# Patient Record
Sex: Female | Born: 1960
Health system: Southern US, Community
[De-identification: ages and names within clinical notes are randomized; demographics above are authoritative.]

## PROBLEM LIST (undated history)

## (undated) DIAGNOSIS — Z9889 Other specified postprocedural states: Secondary | ICD-10-CM

## (undated) DIAGNOSIS — I10 Essential (primary) hypertension: Secondary | ICD-10-CM

## (undated) DIAGNOSIS — T8859XA Other complications of anesthesia, initial encounter: Secondary | ICD-10-CM

## (undated) DIAGNOSIS — F329 Major depressive disorder, single episode, unspecified: Secondary | ICD-10-CM

## (undated) DIAGNOSIS — E785 Hyperlipidemia, unspecified: Secondary | ICD-10-CM

## (undated) DIAGNOSIS — I2510 Atherosclerotic heart disease of native coronary artery without angina pectoris: Secondary | ICD-10-CM

## (undated) DIAGNOSIS — F32A Depression, unspecified: Secondary | ICD-10-CM

## (undated) DIAGNOSIS — K5792 Diverticulitis of intestine, part unspecified, without perforation or abscess without bleeding: Secondary | ICD-10-CM

## (undated) DIAGNOSIS — K219 Gastro-esophageal reflux disease without esophagitis: Secondary | ICD-10-CM

## (undated) DIAGNOSIS — R112 Nausea with vomiting, unspecified: Secondary | ICD-10-CM

## (undated) DIAGNOSIS — F419 Anxiety disorder, unspecified: Secondary | ICD-10-CM

## (undated) HISTORY — PX: TONSILECTOMY/ADENOIDECTOMY WITH MYRINGOTOMY: SHX6125

## (undated) HISTORY — DX: Atherosclerotic heart disease of native coronary artery without angina pectoris: I25.10

## (undated) HISTORY — DX: Anxiety disorder, unspecified: F41.9

## (undated) HISTORY — DX: Depression, unspecified: F32.A

## (undated) HISTORY — PX: TUBAL LIGATION: SHX77

## (undated) HISTORY — PX: CHOLECYSTECTOMY: SHX55

## (undated) HISTORY — PX: ANKLE FRACTURE SURGERY: SHX122

## (undated) HISTORY — DX: Major depressive disorder, single episode, unspecified: F32.9

## (undated) HISTORY — DX: Hyperlipidemia, unspecified: E78.5

---

## 2001-08-29 ENCOUNTER — Emergency Department (HOSPITAL_COMMUNITY): Admission: EM | Admit: 2001-08-29 | Discharge: 2001-08-30 | Payer: Self-pay | Admitting: Emergency Medicine

## 2001-08-29 ENCOUNTER — Encounter: Payer: Self-pay | Admitting: Emergency Medicine

## 2003-05-06 ENCOUNTER — Encounter: Admission: RE | Admit: 2003-05-06 | Discharge: 2003-05-25 | Payer: Self-pay | Admitting: Occupational Medicine

## 2003-08-05 ENCOUNTER — Emergency Department (HOSPITAL_COMMUNITY): Admission: EM | Admit: 2003-08-05 | Discharge: 2003-08-06 | Payer: Self-pay | Admitting: Emergency Medicine

## 2004-02-06 ENCOUNTER — Emergency Department (HOSPITAL_COMMUNITY): Admission: EM | Admit: 2004-02-06 | Discharge: 2004-02-07 | Payer: Self-pay | Admitting: Emergency Medicine

## 2004-07-22 ENCOUNTER — Ambulatory Visit: Payer: Self-pay | Admitting: Sports Medicine

## 2004-08-09 ENCOUNTER — Ambulatory Visit: Payer: Self-pay | Admitting: Sports Medicine

## 2005-09-04 ENCOUNTER — Encounter: Admission: RE | Admit: 2005-09-04 | Discharge: 2005-09-04 | Payer: Self-pay | Admitting: Sports Medicine

## 2006-10-08 ENCOUNTER — Inpatient Hospital Stay (HOSPITAL_COMMUNITY): Admission: AD | Admit: 2006-10-08 | Discharge: 2006-10-10 | Payer: Self-pay | Admitting: Psychiatry

## 2006-10-08 ENCOUNTER — Ambulatory Visit: Payer: Self-pay | Admitting: Psychiatry

## 2007-04-05 ENCOUNTER — Ambulatory Visit (HOSPITAL_COMMUNITY): Admission: RE | Admit: 2007-04-05 | Discharge: 2007-04-05 | Payer: Self-pay | Admitting: Obstetrics and Gynecology

## 2007-04-22 ENCOUNTER — Emergency Department (HOSPITAL_COMMUNITY): Admission: EM | Admit: 2007-04-22 | Discharge: 2007-04-22 | Payer: Self-pay | Admitting: Emergency Medicine

## 2008-06-12 ENCOUNTER — Ambulatory Visit (HOSPITAL_COMMUNITY): Admission: RE | Admit: 2008-06-12 | Discharge: 2008-06-12 | Payer: Self-pay | Admitting: Obstetrics and Gynecology

## 2009-07-09 ENCOUNTER — Ambulatory Visit (HOSPITAL_COMMUNITY): Admission: RE | Admit: 2009-07-09 | Discharge: 2009-07-09 | Payer: Self-pay | Admitting: Obstetrics and Gynecology

## 2010-02-08 ENCOUNTER — Emergency Department (HOSPITAL_COMMUNITY): Admission: EM | Admit: 2010-02-08 | Discharge: 2010-02-08 | Payer: Self-pay | Admitting: Emergency Medicine

## 2010-02-10 ENCOUNTER — Ambulatory Visit (HOSPITAL_BASED_OUTPATIENT_CLINIC_OR_DEPARTMENT_OTHER): Admission: RE | Admit: 2010-02-10 | Discharge: 2010-02-11 | Payer: Self-pay | Admitting: Orthopedic Surgery

## 2010-02-25 ENCOUNTER — Ambulatory Visit: Payer: Self-pay | Admitting: Surgery

## 2010-02-25 ENCOUNTER — Encounter (INDEPENDENT_AMBULATORY_CARE_PROVIDER_SITE_OTHER): Payer: Self-pay | Admitting: Orthopedic Surgery

## 2010-02-25 ENCOUNTER — Ambulatory Visit: Admission: RE | Admit: 2010-02-25 | Discharge: 2010-02-25 | Payer: Self-pay | Admitting: Orthopedic Surgery

## 2010-04-11 ENCOUNTER — Encounter
Admission: RE | Admit: 2010-04-11 | Discharge: 2010-05-19 | Payer: Self-pay | Source: Home / Self Care | Attending: Orthopedic Surgery | Admitting: Orthopedic Surgery

## 2010-05-23 ENCOUNTER — Encounter
Admission: RE | Admit: 2010-05-23 | Discharge: 2010-06-21 | Payer: Self-pay | Source: Home / Self Care | Attending: Orthopedic Surgery | Admitting: Orthopedic Surgery

## 2010-05-30 ENCOUNTER — Encounter: Admit: 2010-05-30 | Payer: Self-pay | Admitting: Orthopedic Surgery

## 2010-06-09 ENCOUNTER — Encounter: Admit: 2010-06-09 | Payer: Self-pay | Admitting: Orthopedic Surgery

## 2010-06-12 ENCOUNTER — Encounter: Payer: Self-pay | Admitting: Obstetrics and Gynecology

## 2010-09-19 ENCOUNTER — Inpatient Hospital Stay (HOSPITAL_COMMUNITY)
Admission: AD | Admit: 2010-09-19 | Discharge: 2010-09-19 | Disposition: A | Discharge status: Home / Self Care | Payer: 59 | Source: Ambulatory Visit | Attending: Obstetrics and Gynecology | Admitting: Obstetrics and Gynecology

## 2010-09-19 ENCOUNTER — Inpatient Hospital Stay (HOSPITAL_COMMUNITY): Payer: 59

## 2010-09-19 DIAGNOSIS — K5732 Diverticulitis of large intestine without perforation or abscess without bleeding: Secondary | ICD-10-CM | POA: Insufficient documentation

## 2010-09-19 DIAGNOSIS — R1032 Left lower quadrant pain: Secondary | ICD-10-CM | POA: Insufficient documentation

## 2010-09-19 LAB — URINE MICROSCOPIC-ADD ON

## 2010-09-19 LAB — DIFFERENTIAL
Basophils Absolute: 0.1 K/uL (ref 0.0–0.1)
Basophils Relative: 1 % (ref 0–1)
Eosinophils Absolute: 0.3 K/uL (ref 0.0–0.7)
Eosinophils Relative: 3 % (ref 0–5)
Lymphocytes Relative: 20 % (ref 12–46)
Lymphs Abs: 2.1 K/uL (ref 0.7–4.0)
Monocytes Absolute: 0.5 K/uL (ref 0.1–1.0)
Monocytes Relative: 5 % (ref 3–12)
Neutro Abs: 7.5 K/uL (ref 1.7–7.7)
Neutrophils Relative %: 72 % (ref 43–77)

## 2010-09-19 LAB — URINALYSIS, ROUTINE W REFLEX MICROSCOPIC
Glucose, UA: 250 mg/dL — AB
Ketones, ur: 15 mg/dL — AB
Protein, ur: 100 mg/dL — AB
Urobilinogen, UA: >8 mg/dL — ABNORMAL HIGH (ref 0.0–1.0)

## 2010-09-19 LAB — COMPREHENSIVE METABOLIC PANEL
AST: 32 U/L (ref 0–37)
Alkaline Phosphatase: 98 U/L (ref 39–117)
BUN: 5 mg/dL — ABNORMAL LOW (ref 6–23)
CO2: 29 mEq/L (ref 19–32)
Calcium: 9.7 mg/dL (ref 8.4–10.5)
GFR calc Af Amer: >60 mL/min (ref >60)
Potassium: 4.7 mEq/L (ref 3.5–5.1)
Sodium: 140 mEq/L (ref 135–145)

## 2010-09-19 LAB — CBC
HCT: 43.7 % (ref 36.0–46.0)
Hemoglobin: 14.4 g/dL (ref 12.0–15.0)
MCH: 31 pg (ref 26.0–34.0)
MCHC: 33 g/dL (ref 30.0–36.0)
MCV: 94 fL (ref 78.0–100.0)
Platelets: 349 K/uL (ref 150–400)
RBC: 4.65 MIL/uL (ref 3.87–5.11)
RDW: 12.6 % (ref 11.5–15.5)
WBC: 10.4 K/uL (ref 4.0–10.5)

## 2010-09-22 ENCOUNTER — Encounter (HOSPITAL_COMMUNITY): Payer: Self-pay

## 2010-09-22 ENCOUNTER — Ambulatory Visit (HOSPITAL_COMMUNITY)
Admission: RE | Admit: 2010-09-22 | Discharge: 2010-09-22 | Disposition: A | Discharge status: Home / Self Care | Payer: 59 | Source: Ambulatory Visit | Attending: Gastroenterology | Admitting: Gastroenterology

## 2010-09-22 ENCOUNTER — Other Ambulatory Visit (HOSPITAL_COMMUNITY): Payer: Self-pay | Admitting: Gastroenterology

## 2010-09-22 DIAGNOSIS — K7689 Other specified diseases of liver: Secondary | ICD-10-CM | POA: Insufficient documentation

## 2010-09-22 DIAGNOSIS — K5732 Diverticulitis of large intestine without perforation or abscess without bleeding: Secondary | ICD-10-CM | POA: Insufficient documentation

## 2010-09-22 DIAGNOSIS — Z9089 Acquired absence of other organs: Secondary | ICD-10-CM | POA: Insufficient documentation

## 2010-09-22 DIAGNOSIS — R109 Unspecified abdominal pain: Secondary | ICD-10-CM | POA: Insufficient documentation

## 2010-09-22 DIAGNOSIS — R634 Abnormal weight loss: Secondary | ICD-10-CM | POA: Insufficient documentation

## 2010-09-22 DIAGNOSIS — K5792 Diverticulitis of intestine, part unspecified, without perforation or abscess without bleeding: Secondary | ICD-10-CM

## 2010-09-22 DIAGNOSIS — D259 Leiomyoma of uterus, unspecified: Secondary | ICD-10-CM | POA: Insufficient documentation

## 2010-09-22 HISTORY — DX: Diverticulitis of intestine, part unspecified, without perforation or abscess without bleeding: K57.92

## 2010-09-22 MED ORDER — IOHEXOL 300 MG/ML  SOLN
100.0000 mL | Freq: Once | INTRAMUSCULAR | Status: AC | PRN
  Administered 2010-09-22: 100 mL via INTRAVENOUS

## 2010-10-04 NOTE — H&P (Signed)
NAME:  Danielle Silva, Danielle Silva                ACCOUNT NO.:  [id number]   MEDICAL RECORD NO.:  [id number]          PATIENT TYPE:  IPS   LOCATION:  0605                          FACILITY:  [facility]   PHYSICIAN:  Anselm Jungling, MD  DATE OF BIRTH:  03-28-1961   DATE OF ADMISSION:  10/08/2006  DATE OF DISCHARGE:                       PSYCHIATRIC ADMISSION ASSESSMENT   IDENTIFYING INFORMATION:  This is a 50 year old, married, white female.  This is a voluntary admission.   HISTORY OF PRESENT ILLNESS:  This patient was referred by the [facility] Emergency Room after she presented there drowsy.  Her son had  found her very drowsy with empty pill bottle scattered around her.  She  was asleep on the cough and had difficulty arousing her.  She admitted  to EMS that she had overdosed on the Ambien, trazodone, and 2 shots of  rum and in additionally some Benadryl with intention to go to sleep.  She denies that she was trying to kill her.  Did admit she had been  unable to sleep and wanted to sleep at least for a day or so.  She cites  significant recent depression with her primary stressor of this being  the 2-year anniversary of the death of her eldest son by an accidental  drug overdose.  Additionally approximately 1 week ago on Mother's Day,  her husband told her that he wanted to end their marriage of 20 years.  She reports that she has been feeling overwhelmed, tearful, and sad and  has been unable to sleep more than 1-2 hours a night with broken sleep.  Appetite is poor.  She has been prescribed Ambien CR 12.5 mg with little  help.  She had been prescribed Cymbalta which caused her to feel  paresthesias in her hands.  She denies problems with substance abuse.  Previously drank 1-2 glasses of wine daily until February of this year  when she stopped on her own and now use of alcohol is rare.  She does  have a history of one prior overdose in June 2007.   PAST PSYCHIATRIC HISTORY:  The patient  is followed as an outpatient by  Dr. Terrace Arabia, who is her psychologist in [place].  This is her  first inpatient psychiatric admission.  She does have a history of a  short-term stay in mid June of 2007 for a drug overdose in the [facility] and has been followed by a psychologist since.  She denies any  substance abuse or addiction.  No history of mania.   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  Married for 20 years.  Currently has twin  sons age 60 alive and well.  Has one son who was deceased 2 years ago  who was her eldest child.  She has no legal problems.   FAMILY HISTORY:  Remarkable for father with history of alcoholism and  son with issues of drug abuse who is deceased.  Alcohol and drug history  as noted above.   MEDICAL HISTORY:  The patient is followed by  Dr. Kathrynn Speed in [place].  Medical problems are no chronic medical problems.  She is  status post polypharmacy overdose as noted above.  She did have gag  reflex in place and did not require ventilator support.   PAST MEDICAL HISTORY:  Remarkable for history of cholecystectomy, C-  section, tonsillectomy and adenoidectomy, and 1 prior overdose.   MEDICATIONS:  1. Cymbalta which she tried at 30 mg daily for 1-1/2 weeks which      caused paresthesias, and she stopped it.  2. Ambien CR 12.5 mg nightly p.r.n.  3. She does take phentermine 37.5 mg 1-1/2 tablets daily.   DRUG ALLERGIES:  MACROBID, CIPRO, and SULFA.   POSITIVE PHYSICAL FINDINGS:  The patient's full physical exam was done  in the Emergency Room.  As noted in the record, generally unremarkable.  This is a healthy-appearing, white female who appears to be younger than  her stated age of 72.  Medium build.  Normal motor exam.  Fully alert  today.  No somatic complaints.  5 feet 2 inches tall, 171 pounds,  temperature 97.9, pulse 80, respirations 18, blood pressure 101/71.    DIAGNOSTIC STUDIES:  CBC is within normal limits.  Acetaminophen level  was less 10.  Salicylate level less than 1.  Ethanol level was 0.01.  Chemistries:  Sodium 143, potassium 4.3, chloride 102, carbon dioxide  21, BUN 7, creatinine 0.78, and random glucose 60.  Liver enzymes are  all within normal limits, and her urine drug screen was negative for all  substances.  Urinalysis was remarkable for a specific gravity 1.002 with  a 3+ urobilinogen, casts, and no significant white cells.  Urine  pregnancy test was negative.  Her PO2 on presentation was decreased at  63.   MENTAL STATUS EXAM:  Medium build female fully alert in no distress.  Does have a constricted affect and quite a bit of tearfulness as she  talks about her multiple stressors.  Speech is soft in tone but adequate  production.  She is candid and forthcoming about her depression and her  recent stressors.  Mood is depressed.  Thought process logical,  coherent, goal directed.  She is denying any suicidal thoughts today but  has clearly had some suicidal ideation at least passive over the course  of the past couple weeks.  Admits to feeling very overwhelmed after her  husband talked to her about wanting a divorce.  She has made  arrangements to have her own apartment which will be ready Wednesday,  and she is demonstrating some goal directed thinking about her future  and getting her things organized to live on her own without him.  Cognition is completely intact.  Oriented x3.  Calculation and  concentration are adequate.  Impulse control and judgment within normal  limits.   AXIS I:  Major depression recurrent, severe.  AXIS II:  Deferred.  AXIS III:  Status post polypharmacy overdose.  AXIS IV:  Severe grief and loss issues.  AXIS V:  Current 38, past year 47.   Plan is to voluntarily admit the patient with q.15 minute checks in place to stabilize her mood and alleviate her suicidal thoughts.  We are  going to  discontinue the Ambien that is part of her protocol because of  her recent overdose on this.  We will start her on trazodone 50 mg  nightly p.r.n. insomnia and that may be repeated x1, and we will start a  trial of Lexapro 5  mg daily.  Estimated length of stay is 5 days.      Margaret A. Lorin Picket, N.P.      Anselm Jungling, MD  Electronically Signed    MAS/MEDQ  D:  10/09/2006  T:  10/09/2006  Job:  [id number]

## 2011-07-24 ENCOUNTER — Ambulatory Visit: Payer: 59 | Admitting: Internal Medicine

## 2011-07-24 DIAGNOSIS — Z0289 Encounter for other administrative examinations: Secondary | ICD-10-CM

## 2012-01-31 ENCOUNTER — Other Ambulatory Visit (HOSPITAL_COMMUNITY): Payer: Self-pay | Admitting: Obstetrics and Gynecology

## 2012-01-31 DIAGNOSIS — Z1231 Encounter for screening mammogram for malignant neoplasm of breast: Secondary | ICD-10-CM

## 2012-02-08 ENCOUNTER — Ambulatory Visit (HOSPITAL_COMMUNITY)
Admission: RE | Admit: 2012-02-08 | Discharge: 2012-02-08 | Disposition: A | Discharge status: Home / Self Care | Payer: 59 | Source: Ambulatory Visit | Attending: Obstetrics and Gynecology | Admitting: Obstetrics and Gynecology

## 2012-02-08 DIAGNOSIS — Z1231 Encounter for screening mammogram for malignant neoplasm of breast: Secondary | ICD-10-CM | POA: Insufficient documentation

## 2013-02-19 ENCOUNTER — Other Ambulatory Visit (HOSPITAL_COMMUNITY): Payer: Self-pay | Admitting: Obstetrics and Gynecology

## 2013-02-19 DIAGNOSIS — Z1231 Encounter for screening mammogram for malignant neoplasm of breast: Secondary | ICD-10-CM

## 2013-03-07 ENCOUNTER — Ambulatory Visit (HOSPITAL_COMMUNITY)
Admission: RE | Admit: 2013-03-07 | Discharge: 2013-03-07 | Disposition: A | Discharge status: Home / Self Care | Payer: 59 | Source: Ambulatory Visit | Attending: Obstetrics and Gynecology | Admitting: Obstetrics and Gynecology

## 2013-03-07 DIAGNOSIS — Z1231 Encounter for screening mammogram for malignant neoplasm of breast: Secondary | ICD-10-CM | POA: Insufficient documentation

## 2013-03-26 ENCOUNTER — Emergency Department (HOSPITAL_BASED_OUTPATIENT_CLINIC_OR_DEPARTMENT_OTHER): Payer: 59

## 2013-03-26 ENCOUNTER — Encounter (HOSPITAL_BASED_OUTPATIENT_CLINIC_OR_DEPARTMENT_OTHER): Payer: Self-pay | Admitting: Emergency Medicine

## 2013-03-26 ENCOUNTER — Emergency Department (HOSPITAL_BASED_OUTPATIENT_CLINIC_OR_DEPARTMENT_OTHER)
Admission: EM | Admit: 2013-03-26 | Discharge: 2013-03-26 | Disposition: A | Discharge status: Home / Self Care | Payer: 59 | Attending: Emergency Medicine | Admitting: Emergency Medicine

## 2013-03-26 DIAGNOSIS — M7989 Other specified soft tissue disorders: Secondary | ICD-10-CM | POA: Insufficient documentation

## 2013-03-26 DIAGNOSIS — T148XXA Other injury of unspecified body region, initial encounter: Secondary | ICD-10-CM

## 2013-03-26 DIAGNOSIS — S8000XA Contusion of unspecified knee, initial encounter: Secondary | ICD-10-CM | POA: Insufficient documentation

## 2013-03-26 DIAGNOSIS — Z9889 Other specified postprocedural states: Secondary | ICD-10-CM | POA: Insufficient documentation

## 2013-03-26 DIAGNOSIS — L0390 Cellulitis, unspecified: Secondary | ICD-10-CM

## 2013-03-26 DIAGNOSIS — S8990XA Unspecified injury of unspecified lower leg, initial encounter: Secondary | ICD-10-CM | POA: Insufficient documentation

## 2013-03-26 DIAGNOSIS — Y9389 Activity, other specified: Secondary | ICD-10-CM | POA: Insufficient documentation

## 2013-03-26 DIAGNOSIS — S8992XA Unspecified injury of left lower leg, initial encounter: Secondary | ICD-10-CM

## 2013-03-26 DIAGNOSIS — Z8719 Personal history of other diseases of the digestive system: Secondary | ICD-10-CM | POA: Insufficient documentation

## 2013-03-26 DIAGNOSIS — W010XXA Fall on same level from slipping, tripping and stumbling without subsequent striking against object, initial encounter: Secondary | ICD-10-CM | POA: Insufficient documentation

## 2013-03-26 DIAGNOSIS — Y9239 Other specified sports and athletic area as the place of occurrence of the external cause: Secondary | ICD-10-CM | POA: Insufficient documentation

## 2013-03-26 MED ORDER — TRAMADOL HCL 50 MG PO TABS
50.0000 mg | ORAL_TABLET | Freq: Once | ORAL | Status: AC
  Administered 2013-03-26: 50 mg via ORAL
  Filled 2013-03-26: qty 1

## 2013-03-26 MED ORDER — SULFAMETHOXAZOLE-TRIMETHOPRIM 800-160 MG PO TABS: 1.0000 | ORAL_TABLET | Freq: Two times a day (BID) | ORAL | Status: AC

## 2013-03-26 MED ORDER — TRAMADOL HCL 50 MG PO TABS: 50.0000 mg | ORAL_TABLET | Freq: Four times a day (QID) | ORAL | Status: DC | PRN

## 2013-03-26 NOTE — ED Provider Notes (Signed)
TIME SEEN: 6:12 PM  This chart was scribed for Danielle Maw Ward, DO by Arlan Organ, ED Scribe. This patient was seen in room MH01/MH01 and the patient's care was started 6:21 PM.   CHIEF COMPLAINT: Left lower leg pain  HPI:  HPI Comments: Danielle Silva is a 52 y.o. female with no significant past medical history who presents to the Emergency Department complaining of a lower leg injury that started 2.5 weeks ago, but has worsened in the last 3 days. She states the pain started after she tripped and fell down on bleachers and landed on the anterior part of her left lower extremity. Pt denies any LOC, head injury during this fall. She also reports radiating pain to the back of her calf and up into her hip and thigh. She states over time, her leg started to get better, but she recently noticed swelling, warmth, and a "burning feeling" in the affected area.  Pt denies numbness or focal weakness. No fever or warmth in her leg. No prior history of DVT.  ROS: See HPI Constitutional: no fever  Eyes: no drainage  ENT: no runny nose   Cardiovascular:  no chest pain  Resp: no SOB  GI: no vomiting GU: no dysuria Integumentary: no rash  Allergy: no hives  Musculoskeletal: no leg swelling; left lower leg pain Neurological: no slurred speech ROS otherwise negative  PAST MEDICAL HISTORY/PAST SURGICAL HISTORY:  Past Medical History  Diagnosis Date  . Diverticulitis     MEDICATIONS:  Prior to Admission medications   Not on File    ALLERGIES:  Allergies  Allergen Reactions  . Macrobid [Nitrofurantoin Monohydrate Macrocrystals]   . Percocet [Oxycodone-Acetaminophen]     SOCIAL HISTORY:  History  Substance Use Topics  . Smoking status: Not on file  . Smokeless tobacco: Not on file  . Alcohol Use: Not on file    FAMILY HISTORY: No family history on file.  EXAM: Filed Vitals:   03/26/13 1757  BP: 166/92  Pulse: 83  Temp: 98.1 F (36.7 C)  Resp: 18    CONSTITUTIONAL: Alert and  oriented and responds appropriately to questions. Well-appearing; well-nourished; GCS 15 HEAD: Normocephalic; atraumatic EYES: Conjunctivae clear, PERRL, EOMI ENT: normal nose; no rhinorrhea; moist mucous membranes; pharynx without lesions noted; no dental injury; no septal hematoma NECK: Supple, no meningismus, no LAD; no midline spinal tenderness, step-off or deformity CARD: RRR; S1 and S2 appreciated; no murmurs, no clicks, no rubs, no gallops RESP: Normal chest excursion without splinting or tachypnea; breath sounds clear and equal bilaterally; no wheezes, no rhonchi, no rales; chest wall stable, nontender to palpation ABD/GI: Normal bowel sounds; non-distended; soft, non-tender, no rebound, no guarding PELVIS:  stable, nontender to palpation BACK:  The back appears normal and is non-tender to palpation, there is no CVA tenderness; no midline spinal tenderness, step-off or deformity EXT: Normal ROM in all joints; small abrasion and area of erythema to mid anterior left shin without associated warmth or induration; tenderness to palpation over anterior and posterior calf diffusely; 2 plus DP pluses; sensation to light touch intact diffusely; patient has mild increased swelling in the left lower extremity compared to right, no pitting edema, normal capillary refill; no cyanosis; full range of motion in the toes, hip, knee on the left side; patient has had a prior ORIF of the left ankle with decreased dorsiflexion which is baseline SKIN: Normal color for age and race; warm NEURO: Moves all extremities equally PSYCH: The patient's mood and manner  are appropriate. Grooming and personal hygiene are appropriate.  MEDICAL DECISION MAKING: Patient with injury to her left lower extremity trap weeks ago that has progressively become more painful and swollen. Will obtain x-rays of her tibia/fibula and ankle as well as venous Doppler to rule out DVT. Will give tramadol for pain. She states her tetanus  vaccination has been within the last 10 years.  ED PROGRESS: X-ray showed no acute fracture. Left ankle hardware is in place. Venous Doppler shows no sign of DVT. Patient is having some mild increased redness and now warmth over this area. Given she has an abrasion to this region, patient may have small area of cellulitis. We'll discharge home with prescription for Bactrim and Tylenol. Given return precautions. Patient has PCP followup on Monday, 5 days from now. Patient verbalizes understanding and is comfortable with plan.     Danielle Maw Ward, DO 03/26/13 [phone]

## 2013-03-26 NOTE — ED Notes (Signed)
Injured left  lower leg 2.5 weeks ago  States last Thursday became more swollen and painful with pain radiating around to back of calf and up into hip and thigh. Bruising noted left knee no extended trips she does sit at desk all day

## 2013-05-05 ENCOUNTER — Ambulatory Visit (INDEPENDENT_AMBULATORY_CARE_PROVIDER_SITE_OTHER): Payer: 59 | Admitting: Internal Medicine

## 2013-05-05 ENCOUNTER — Encounter: Payer: Self-pay | Admitting: Internal Medicine

## 2013-05-05 VITALS — BP 130/92 | HR 93 | Temp 98.0°F | Resp 12 | Ht 63.0 in | Wt 221.3 lb

## 2013-05-05 DIAGNOSIS — R635 Abnormal weight gain: Secondary | ICD-10-CM

## 2013-05-05 DIAGNOSIS — R5381 Other malaise: Secondary | ICD-10-CM

## 2013-05-05 DIAGNOSIS — E041 Nontoxic single thyroid nodule: Secondary | ICD-10-CM

## 2013-05-05 DIAGNOSIS — E042 Nontoxic multinodular goiter: Secondary | ICD-10-CM

## 2013-05-05 LAB — TSH: TSH: 1 u[IU]/mL (ref 0.35–5.50)

## 2013-05-05 LAB — T3, FREE: T3, Free: 3.2 pg/mL (ref 2.3–4.2)

## 2013-05-05 LAB — T4, FREE: Free T4: 0.68 ng/dL (ref 0.60–1.60)

## 2013-05-05 NOTE — Patient Instructions (Signed)
Please stop at the lab. You will be called to schedule the thyroid U/S. Please join MyChart.

## 2013-05-05 NOTE — Progress Notes (Signed)
Patient ID: Danielle Silva, female   DOB: 07/31/1960, 52 y.o.   MRN: [id number]   HPI  Danielle Silva is a 52 y.o.-year-old female, self-referred for evaluation for hypothyroidism. PCP: Dr Kathrynn Speed. ObGyn: Dr Cherly Hensen. Psych: Dr Evelene Croon.  I reviewed pt's thyroid tests from 04/06/2013: TSH 1.009 fT4 0.69 TT3 98 (60-181)   Pt denies feeling nodules in neck, hoarseness, dysphagia/odynophagia, SOB with lying down.  Pt describes: - alternating hot + cold intolerance - + weight gain - used to be an avid runner >> broke leg in 2011 >> could not run >> weight 140-145 lbs  - last year 180 lbs - this year 221 lbs. Now walks and does total body workout 55 min 4x a week. Skips more meals nowadays. - + fatigue - + insomnia and has days when sleeps all day long - + constipation - recently, previously diarrhea - + dry skin - no hair falling - fluid retention, swelling - + depression - + new memory problems. Tried Adderrall, now off (made her sleepy). - no menstrual cycles since 12/2011. FSH 48. LH 40. E2 14.76.  Other labs from PCP (04/06/2013):  HbA1c 5.3%  CBC nl. CMP nl.  No FH of thyroid disorders. No FH of thyroid cancer. Father with MG >> had thymus removed. No h/o radiation tx to head or neck.   No recent use of iodine supplements.  ROS: Constitutional: see HPI Eyes: + blurry vision, no xerophthalmia ENT: no sore throat, + nodules palpated in throat, no dysphagia/odynophagia, no hoarseness, + tinnitus Cardiovascular: no CP/SOB/+ palpitations/+ leg swelling Respiratory: no cough/SOB Gastrointestinal: no N/V/D/+C/+ heartburn Musculoskeletal: + muscle/+ joint aches Skin: no rashes, + easy bruisisng Neurological: no tremors/numbness/tingling/dizziness, + HA Psychiatric: + depression/no anxiety Low libido  Past Medical History  Diagnosis Date  . Diverticulitis    No past surgical history on file. History   Social History  . Marital Status: Married    Spouse Name: N/A    Number  of Children: 3 - one died 8 years ago   Occupational History  . RN   Social History Main Topics  . Smoking status: Former Games developer  . Smokeless tobacco: Not on file  . Alcohol Use: Yes     Comment: occ  . Drug Use: No  . Sexual Activity: Yes    Partners: Male   Social History Narrative   Married: 3 kids: 8 yr old twins and 74 yr old son   Work: Case [employer]      Regular exercise: Pure bar 4 times a week/yoga, pilates and ballet   Caffeine use: 2 cups of coffee in the AM   Current Outpatient Prescriptions on File Prior to Visit  Medication Sig Dispense Refill  . calcium carbonate (OS-CAL) 600 MG TABS tablet Take 600 mg by mouth 2 (two) times daily with a meal.      . cetirizine (ZYRTEC) 5 MG tablet Take 5 mg by mouth daily.      . citalopram (CELEXA) 40 MG tablet Take 10 mg by mouth daily.       . Multiple Vitamin (MULTIVITAMIN WITH MINERALS) TABS tablet Take 1 tablet by mouth daily.      . traMADol (ULTRAM) 50 MG tablet Take 1 tablet (50 mg total) by mouth every 6 (six) hours as needed.  15 tablet  0   No current facility-administered medications on file prior to visit.   Allergies  Allergen Reactions  . Macrobid [Nitrofurantoin Monohydrate Macrocrystals]   .  Nsaids   . Percocet [Oxycodone-Acetaminophen]    No family history on file.  PE: BP 130/92  Pulse 93  Temp(Src) 98 F (36.7 C) (Oral)  Resp 12  Ht 5\' 3"  (1.6 m)  Wt 221 lb 4.8 oz (100.381 kg)  BMI 39.21 kg/m2  SpO2 96%  LMP 01/05/2012 Wt Readings from Last 3 Encounters:  05/05/13 221 lb 4.8 oz (100.381 kg)  03/26/13 207 lb (93.895 kg)   Constitutional: overweight, in NAD Eyes: PERRLA, EOMI, no exophthalmos ENT: moist mucous membranes, possible R sided thyromegaly vs nodule, no cervical lymphadenopathy Cardiovascular: RRR, No MRG Respiratory: CTA B Gastrointestinal: abdomen soft, NT, ND, BS+ Musculoskeletal: no deformities, strength intact in all 4 Skin: moist, warm, no  rashes Neurological: very fine tremor with outstretched hands, DTR normal in all 4  ASSESSMENT: 1. Fatigue  2. Weigh gain  3. Possible thyroid nodule  PLAN:  1., 2., 3.. Patient without a dx of hypothyroidism, but with some sxs that can be attributed to hypothyroidism or menopause. - She does not appear to have a goiter, thyroid nodules, or neck compression symptoms - we'll check thyroid tests today: TSH, free T4, free T3, TPO Abx - we discussed that if these are normal, she may benefit from low dose bioidentical hormone replacement tx - since I feel a possible R sided nodule, will check a thyroid U/S - If these are abnormal, we will schedule a new appt, OTW, will see her prn  Orders Placed This Encounter  Procedures  . US Soft Tissue Head/Neck  . TSH  . T4, free  . T3, free  . Thyroid Peroxidase Antibody   PCP: Dr Kathrynn Speed.  ObGyn: Dr Cherly Hensen.  Psych: Dr Evelene Croon. GI: Dr Loreta Ave.  Office Visit on 05/05/2013  Component Date Value Range Status  . TSH 05/05/2013 1.00  0.35 - 5.50 uIU/mL Final  . Free T4 05/05/2013 0.68  0.60 - 1.60 ng/dL Final  . T3, Free 40/98/1191 3.2  2.3 - 4.2 pg/mL Final  . Thyroid Peroxidase Antibody 05/05/2013 <10.0  <35.0 IU/mL Final   Comment:                            The thyroid microsomal antigen has been shown to be Thyroid                          Peroxidase (TPO).  This assay detects anti-TPO antibodies.   TFTs normal.   05/09/2013 CLINICAL DATA: Evaluate for thyroid nodule.  EXAM:  THYROID ULTRASOUND  TECHNIQUE:  Ultrasound examination of the thyroid gland and adjacent soft  tissues was performed.  COMPARISON: None.   FINDINGS:   Right thyroid lobe: 5 x 1.4 x 1.7 cm. Within the right lobe of thyroid  mixed nodule with solid and cystic components is identified  anteriorly measuring 1.3 x 0.7 x 0.9 cm. Smaller 0.3 -0.5 cm sized  nodules with cystic features are identified within the central  portion of the right lobe. Posteriorly within  the right lobe a 0.74  x 0.47 x 0.78 cm cystic nodule projects within the posterior aspect  of the right lobe. A punctate calcification is identified within a  small nodule is slightly anteriorly in the right lobe measuring 0.42  cm in diameter.   Left thyroid lobe: 5 x 1.7 x 1.9 cm. A 0.84 x 0.58 x 0.65 cm solid nodule  projects anteriorly and medially within the left  lobe. More  centrally a 0.72 x 0.48 x 0.69 cm nodule is identified. Within the  posterior aspect of the left lobe there is 2.6 x 1.1 x 1.6 cm primarily  cystic nodule with peripheral solid components is identified. The  left lobe demonstrates a heterogeneous architecture.   Isthmus: 0.2 cm. No nodules visualized.   Lymphadenopathy None visualized.   IMPRESSION:  Multiple nodules in the right and left lobes of thyroid. These  dominant nodules contain mixed solid and cystic components. These  nodules are of a size which would be amenable to percutaneous tissue  sampling, which recommended. Otherwise is smaller nodules can be  further evaluated with surveillance ultrasound evaluation in 6  months.  Electronically Signed  By: Salome Holmes M.D.  On: 05/08/2013 17:35  Will suggest FNA of the 2 larger nodules above. The left one is mostly cystic, so probably lower yield...  Pt agrees. Will order FNA.  05/29/2013 Adequacy Reason Satisfactory For Evaluation. Diagnosis THYROID, FINE NEEDLE ASPIRATION RIGHT LOBE, (SPECIMEN 1 OF 2, COLLECTED ON 05/28/2013) BENIGN. FINDINGS CONSISTENT WITH A BENIGN FOLLICULAR NODULE. Pecola Leisure MD Pathologist, Electronic Signature (Case signed 05/29/2013) Specimen Clinical Information Within the right lobe of thyroid-mixed nodule with solid and cystic components is identified anteriorly measuring 1.3 x 0.7 x 0.9 cm, Evaluate for thyroid nodule  Adequacy Reason Satisfactory For Evaluation. Diagnosis THYROID, FINE NEEDLE ASPIRATION, LEFT LOBE POSTERIOR ASPECT,(SPECIMEN 2 OF  2, COLLECTED ON 1/7/): BENIGN. FINDINGS CONSISTENT WITH A BENIGN COLLOID NODULE. Pecola Leisure MD Pathologist, Electronic Signature (Case signed 05/29/2013) Specimen Clinical Information Within the posterior aspect of the left lobe is a 2.6 x 1.1 x 1.6 cm primarily cystic nodule with peripheral solid components is identified, Evaulate for thyroid nodule  I will see the pt back in 1 year - will let her know.

## 2013-05-06 ENCOUNTER — Encounter: Payer: Self-pay | Admitting: Internal Medicine

## 2013-05-06 LAB — THYROID PEROXIDASE ANTIBODY: Thyroperoxidase Ab SerPl-aCnc: <10 IU/mL (ref <35.0)

## 2013-05-08 ENCOUNTER — Ambulatory Visit
Admission: RE | Admit: 2013-05-08 | Discharge: 2013-05-08 | Disposition: A | Discharge status: Home / Self Care | Payer: 59 | Source: Ambulatory Visit | Attending: Internal Medicine | Admitting: Internal Medicine

## 2013-05-12 ENCOUNTER — Encounter: Payer: Self-pay | Admitting: Internal Medicine

## 2013-05-13 ENCOUNTER — Telehealth: Payer: Self-pay | Admitting: *Deleted

## 2013-05-13 NOTE — Telephone Encounter (Signed)
Pt called about scheduling a thyroid biopsy. Please advise if I need to do this for her. Thank you.

## 2013-05-14 NOTE — Telephone Encounter (Signed)
Ordered. I let her know.

## 2013-05-19 ENCOUNTER — Other Ambulatory Visit: Payer: Self-pay | Admitting: Internal Medicine

## 2013-05-19 DIAGNOSIS — E041 Nontoxic single thyroid nodule: Secondary | ICD-10-CM

## 2013-05-28 ENCOUNTER — Other Ambulatory Visit (HOSPITAL_COMMUNITY)
Admission: RE | Admit: 2013-05-28 | Discharge: 2013-05-28 | Disposition: A | Discharge status: Home / Self Care | Payer: 59 | Source: Ambulatory Visit | Attending: Interventional Radiology | Admitting: Interventional Radiology

## 2013-05-28 ENCOUNTER — Ambulatory Visit
Admission: RE | Admit: 2013-05-28 | Discharge: 2013-05-28 | Disposition: A | Discharge status: Home / Self Care | Payer: 59 | Source: Ambulatory Visit | Attending: Internal Medicine | Admitting: Internal Medicine

## 2013-05-28 DIAGNOSIS — E041 Nontoxic single thyroid nodule: Secondary | ICD-10-CM

## 2013-05-29 ENCOUNTER — Encounter: Payer: Self-pay | Admitting: Internal Medicine

## 2013-05-29 DIAGNOSIS — E042 Nontoxic multinodular goiter: Secondary | ICD-10-CM | POA: Insufficient documentation

## 2013-07-03 ENCOUNTER — Other Ambulatory Visit: Payer: Self-pay | Admitting: Gastroenterology

## 2013-07-03 ENCOUNTER — Ambulatory Visit
Admission: RE | Admit: 2013-07-03 | Discharge: 2013-07-03 | Disposition: A | Discharge status: Home / Self Care | Payer: 59 | Source: Ambulatory Visit | Attending: Gastroenterology | Admitting: Gastroenterology

## 2013-07-03 DIAGNOSIS — R1032 Left lower quadrant pain: Secondary | ICD-10-CM

## 2013-07-03 DIAGNOSIS — R1011 Right upper quadrant pain: Secondary | ICD-10-CM

## 2013-07-03 MED ORDER — IOHEXOL 300 MG/ML  SOLN
125.0000 mL | Freq: Once | INTRAMUSCULAR | Status: AC | PRN
  Administered 2013-07-03: 125 mL via INTRAVENOUS

## 2013-07-05 ENCOUNTER — Emergency Department (HOSPITAL_BASED_OUTPATIENT_CLINIC_OR_DEPARTMENT_OTHER)
Admission: EM | Admit: 2013-07-05 | Discharge: 2013-07-05 | Disposition: A | Discharge status: Home / Self Care | Payer: 59 | Attending: Emergency Medicine | Admitting: Emergency Medicine

## 2013-07-05 ENCOUNTER — Encounter (HOSPITAL_BASED_OUTPATIENT_CLINIC_OR_DEPARTMENT_OTHER): Payer: Self-pay | Admitting: Emergency Medicine

## 2013-07-05 ENCOUNTER — Emergency Department (HOSPITAL_BASED_OUTPATIENT_CLINIC_OR_DEPARTMENT_OTHER): Payer: 59

## 2013-07-05 DIAGNOSIS — R142 Eructation: Principal | ICD-10-CM | POA: Insufficient documentation

## 2013-07-05 DIAGNOSIS — Z9851 Tubal ligation status: Secondary | ICD-10-CM | POA: Insufficient documentation

## 2013-07-05 DIAGNOSIS — Z87891 Personal history of nicotine dependence: Secondary | ICD-10-CM | POA: Insufficient documentation

## 2013-07-05 DIAGNOSIS — F411 Generalized anxiety disorder: Secondary | ICD-10-CM | POA: Insufficient documentation

## 2013-07-05 DIAGNOSIS — Z9089 Acquired absence of other organs: Secondary | ICD-10-CM | POA: Insufficient documentation

## 2013-07-05 DIAGNOSIS — Z3202 Encounter for pregnancy test, result negative: Secondary | ICD-10-CM | POA: Insufficient documentation

## 2013-07-05 DIAGNOSIS — Z9889 Other specified postprocedural states: Secondary | ICD-10-CM | POA: Insufficient documentation

## 2013-07-05 DIAGNOSIS — F3289 Other specified depressive episodes: Secondary | ICD-10-CM | POA: Insufficient documentation

## 2013-07-05 DIAGNOSIS — Z8719 Personal history of other diseases of the digestive system: Secondary | ICD-10-CM | POA: Insufficient documentation

## 2013-07-05 DIAGNOSIS — R140 Abdominal distension (gaseous): Secondary | ICD-10-CM

## 2013-07-05 DIAGNOSIS — Z79899 Other long term (current) drug therapy: Secondary | ICD-10-CM | POA: Insufficient documentation

## 2013-07-05 DIAGNOSIS — R141 Gas pain: Secondary | ICD-10-CM | POA: Insufficient documentation

## 2013-07-05 DIAGNOSIS — R143 Flatulence: Principal | ICD-10-CM

## 2013-07-05 DIAGNOSIS — F329 Major depressive disorder, single episode, unspecified: Secondary | ICD-10-CM | POA: Insufficient documentation

## 2013-07-05 LAB — COMPREHENSIVE METABOLIC PANEL
ALK PHOS: 95 U/L (ref 39–117)
ALT: 17 U/L (ref 0–35)
AST: 25 U/L (ref 0–37)
Albumin: 4 g/dL (ref 3.5–5.2)
BILIRUBIN TOTAL: 0.3 mg/dL (ref 0.3–1.2)
BUN: 7 mg/dL (ref 6–23)
CHLORIDE: 101 meq/L (ref 96–112)
CO2: 27 meq/L (ref 19–32)
CREATININE: 0.8 mg/dL (ref 0.50–1.10)
Calcium: 9.5 mg/dL (ref 8.4–10.5)
GFR calc Af Amer: >90 mL/min (ref >90)
GFR, EST NON AFRICAN AMERICAN: 85 mL/min — AB (ref >90)
Glucose, Bld: 114 mg/dL — ABNORMAL HIGH (ref 70–99)
POTASSIUM: 4.2 meq/L (ref 3.7–5.3)
SODIUM: 142 meq/L (ref 137–147)
Total Protein: 7.9 g/dL (ref 6.0–8.3)

## 2013-07-05 LAB — URINALYSIS, ROUTINE W REFLEX MICROSCOPIC
Bilirubin Urine: NEGATIVE
GLUCOSE, UA: NEGATIVE mg/dL
Hgb urine dipstick: NEGATIVE
Ketones, ur: NEGATIVE mg/dL
LEUKOCYTES UA: NEGATIVE
Nitrite: NEGATIVE
PH: 6.5 (ref 5.0–8.0)
PROTEIN: NEGATIVE mg/dL
Specific Gravity, Urine: 1.004 — ABNORMAL LOW (ref 1.005–1.030)
Urobilinogen, UA: 0.2 mg/dL (ref 0.0–1.0)

## 2013-07-05 LAB — CBC WITH DIFFERENTIAL/PLATELET
BASOS ABS: 0.1 10*3/uL (ref 0.0–0.1)
Basophils Relative: 1 % (ref 0–1)
Eosinophils Absolute: 0.4 10*3/uL (ref 0.0–0.7)
Eosinophils Relative: 4 % (ref 0–5)
HCT: 42 % (ref 36.0–46.0)
HEMOGLOBIN: 14.1 g/dL (ref 12.0–15.0)
LYMPHS PCT: 28 % (ref 12–46)
Lymphs Abs: 3.2 10*3/uL (ref 0.7–4.0)
MCH: 31.1 pg (ref 26.0–34.0)
MCHC: 33.6 g/dL (ref 30.0–36.0)
MCV: 92.7 fL (ref 78.0–100.0)
Monocytes Absolute: 0.8 10*3/uL (ref 0.1–1.0)
Monocytes Relative: 7 % (ref 3–12)
NEUTROS ABS: 7 10*3/uL (ref 1.7–7.7)
NEUTROS PCT: 62 % (ref 43–77)
Platelets: 331 10*3/uL (ref 150–400)
RBC: 4.53 MIL/uL (ref 3.87–5.11)
RDW: 12.6 % (ref 11.5–15.5)
WBC: 11.4 10*3/uL — AB (ref 4.0–10.5)

## 2013-07-05 LAB — LIPASE, BLOOD: Lipase: 42 U/L (ref 11–59)

## 2013-07-05 LAB — PREGNANCY, URINE: Preg Test, Ur: NEGATIVE

## 2013-07-05 MED ORDER — GI COCKTAIL ~~LOC~~
30.0000 mL | Freq: Once | ORAL | Status: AC
  Administered 2013-07-05: 30 mL via ORAL
  Filled 2013-07-05: qty 30

## 2013-07-05 MED ORDER — TRAMADOL HCL 50 MG PO TABS: 50.0000 mg | ORAL_TABLET | Freq: Four times a day (QID) | ORAL | Status: DC | PRN

## 2013-07-05 MED ORDER — DICYCLOMINE HCL 20 MG PO TABS: 20.0000 mg | ORAL_TABLET | Freq: Two times a day (BID) | ORAL | Status: DC

## 2013-07-05 MED ORDER — DICYCLOMINE HCL 10 MG PO CAPS
10.0000 mg | ORAL_CAPSULE | Freq: Once | ORAL | Status: AC
  Administered 2013-07-05: 10 mg via ORAL
  Filled 2013-07-05: qty 1

## 2013-07-05 NOTE — Discharge Instructions (Signed)
Bloating  Bloating is the feeling of fullness in your belly. You may feel as though your pants are too tight. Often the cause of bloating is overeating, retaining fluids, or having gas in your bowel. It is also caused by swallowing air and eating foods that cause gas. Irritable bowel syndrome is one of the most common causes of bloating. Constipation is also a common cause. Sometimes more serious problems can cause bloating.  SYMPTOMS   Usually there is a feeling of fullness, as though your abdomen is bulged out. There may be mild discomfort.   DIAGNOSIS   Usually no particular testing is necessary for most bloating. If the condition persists and seems to become worse, your caregiver may do additional testing.   TREATMENT   · There is no direct treatment for bloating.  · Do not put gas into the bowel. Avoid chewing gum and sucking on candy. These tend to make you swallow air. Swallowing air can also be a nervous habit. Try to avoid this.  · Avoiding high residue diets will help. Eat foods with soluble fibers (examples include root vegetables, apples, or barley) and substitute dairy products with soy and rice products. This helps irritable bowel syndrome.  · If constipation is the cause, then a high residue diet with more fiber will help.  · Avoid carbonated beverages.  · Over-the-counter preparations are available that help reduce gas. Your pharmacist can help you with this.  SEEK MEDICAL CARE IF:   · Bloating continues and seems to be getting worse.  · You notice a weight gain.  · You have a weight loss but the bloating is getting worse.  · You have changes in your bowel habits or develop nausea or vomiting.  SEEK IMMEDIATE MEDICAL CARE IF:   · You develop shortness of breath or swelling in your legs.  · You have an increase in abdominal pain or develop chest pain.  Document Released: 03/08/2006 Document Revised: 07/31/2011 Document Reviewed: 04/26/2007  [employer]® Patient Information ©2014 [employer].

## 2013-07-05 NOTE — ED Notes (Addendum)
abd bloating  abd pain   Has been seen by gi for same,  Denies n/v no diarrhea

## 2013-07-05 NOTE — ED Notes (Signed)
abd pain, bloating, and nausea since Sunday. Saw PMD for same, had CT done, and was told nothing was wrong.

## 2013-07-05 NOTE — ED Notes (Signed)
Pt c/o heart burn  Given gi cocktail

## 2013-07-05 NOTE — ED Provider Notes (Signed)
CSN: [id number]     Arrival date & time 07/05/13  0136 History   First MD Initiated Contact with Patient 07/05/13 0150     Chief Complaint  Patient presents with  . Abdominal Pain     (Consider location/radiation/quality/duration/timing/severity/associated sxs/prior Treatment) Patient is a 53 y.o. female presenting with abdominal pain. The history is provided by the patient.  Abdominal Pain Pain location:  Generalized Pain quality: bloating   Pain radiates to:  Does not radiate Pain severity:  Moderate Onset quality:  Gradual Duration:  6 days Timing:  Constant Progression:  Unchanged Chronicity:  New Context: not trauma   Relieved by:  Nothing Worsened by:  Nothing tried Ineffective treatments:  None tried Associated symptoms: no dysuria, no fever and no vomiting   Risk factors: no recent hospitalization   Seen by her GI specialist and had a normal CT scan less than 48 hours ago.  Was told it was normal and nothing was wrong but she feels something is wrong.    Past Medical History  Diagnosis Date  . Diverticulitis   . Anxiety   . Depression    Past Surgical History  Procedure Laterality Date  . Cesarean section    . Cholecystectomy    . Tonsilectomy/adenoidectomy with myringotomy    . Ankle fracture surgery      Left ankle  . Tubal ligation     Family History  Problem Relation Age of Onset  . Diabetes Mother   . Hypertension Mother   . Hyperlipidemia Mother   . Heart disease Mother     CABG  . Hypertension Father   . Kidney disease Father     kidney transplant  . Thyroid disease Father   . Heart disease Father     A-fib  . Arthritis Brother    History  Substance Use Topics  . Smoking status: Former Research scientist (life sciences)  . Smokeless tobacco: Not on file  . Alcohol Use: Yes     Comment: occ   OB History   Grav Para Term Preterm Abortions TAB SAB Ect Mult Living                 Review of Systems  Constitutional: Negative for fever.  Gastrointestinal: Positive  for abdominal pain. Negative for vomiting.  Genitourinary: Negative for dysuria.  All other systems reviewed and are negative.      Allergies  Macrobid; Nsaids; and Percocet  Home Medications   Current Outpatient Rx  Name  Route  Sig  Dispense  Refill  . b complex vitamins tablet   Oral   Take 1 tablet by mouth daily.         . cetirizine (ZYRTEC) 5 MG tablet   Oral   Take 5 mg by mouth daily.         . Multiple Vitamin (MULTIVITAMIN WITH MINERALS) TABS tablet   Oral   Take 1 tablet by mouth daily.         Marland Kitchen buPROPion (WELLBUTRIN SR) 150 MG 12 hr tablet   Oral   Take 150 mg by mouth daily.         . calcium carbonate (OS-CAL) 600 MG TABS tablet   Oral   Take 600 mg by mouth 2 (two) times daily with a meal.         . citalopram (CELEXA) 40 MG tablet   Oral   Take 10 mg by mouth daily.          . traMADol Veatrice Bourbon)  50 MG tablet   Oral   Take 1 tablet (50 mg total) by mouth every 6 (six) hours as needed.   15 tablet   0    BP 160/99  Pulse 87  Temp(Src) 97.8 F (36.6 C) (Oral)  Resp 22  Ht 5\' 2"  (1.575 m)  Wt 220 lb (99.791 kg)  BMI 40.23 kg/m2  SpO2 100%  LMP 01/05/2012 Physical Exam  Constitutional: She is oriented to person, place, and time. She appears well-developed and well-nourished. No distress.  HENT:  Head: Normocephalic and atraumatic.  Mouth/Throat: Oropharynx is clear and moist.  Eyes: Conjunctivae are normal. Pupils are equal, round, and reactive to light.  Neck: Normal range of motion. Neck supple.  Cardiovascular: Normal rate and regular rhythm.   Pulmonary/Chest: Effort normal and breath sounds normal. She has no wheezes. She has no rales.  Abdominal: Soft. She exhibits no distension. Bowel sounds are increased. There is no tenderness. There is no rebound and no guarding.  Musculoskeletal: Normal range of motion.  Neurological: She is alert and oriented to person, place, and time.  Skin: Skin is warm and dry.  Psychiatric:  She has a normal mood and affect.    ED Course  Procedures (including critical care time) Labs Review Labs Reviewed  URINALYSIS, ROUTINE W REFLEX MICROSCOPIC - Abnormal; Notable for the following:    Specific Gravity, Urine 1.004 (*)    All other components within normal limits  PREGNANCY, URINE  CBC WITH DIFFERENTIAL  COMPREHENSIVE METABOLIC PANEL  LIPASE, BLOOD   Imaging Review Ct Abdomen Pelvis W Contrast  07/03/2013   CLINICAL DATA:  Left lower quadrant pain for 4 days. Nausea. History of diverticulitis.  EXAM: CT ABDOMEN AND PELVIS WITH CONTRAST  TECHNIQUE: Multidetector CT imaging of the abdomen and pelvis was performed using the standard protocol following bolus administration of intravenous contrast.  CONTRAST:  146mL OMNIPAQUE IOHEXOL 300 MG/ML  SOLN  COMPARISON:  09/22/2010  FINDINGS: There are numerous sigmoid colon diverticula, but no findings of diverticulitis. A few diverticula are noted along the descending colon also without diverticulitis. The colon is otherwise unremarkable. Normal small bowel. Normal appendix.  Lung bases are essentially clear.  Heart is normal in size.  Low-density lesion in the posterior segment of the right lobe of the liver has increased in size to 12 mm. Is still likely a cyst but is nonspecific. No other liver lesions. Liver otherwise unremarkable.  Normal spleen and pancreas. Status post cholecystectomy. No bile duct dilation. Normal adrenal glands. Normal kidneys, ureters and bladder.  4 cm fibroid arises from the right anterior aspect of the lower uterine segment. This is stable. Uterus is otherwise unremarkable. No adnexal masses.  There are no pathologically enlarged lymph nodes. No abnormal fluid collections.  The bony structures are unremarkable.  IMPRESSION: 1. No acute findings.  No evidence of diverticulitis. 2. Multiple sigmoid colon diverticula, without acute inflammatory changes. 3. Small low-density lesion in the posterior segment of the right  liver lobe has increased in size. It is still likely a cyst. 4. Status post cholecystectomy. 5. Uterine fibroid. 6. No other abnormalities.   Electronically Signed   By: Lajean Manes M.D.   On: 07/03/2013 15:47    EKG Interpretation   None       MDM   Final diagnoses:  None    Exam and vitals benign CT normal within 36 hours.  Do not think there is indication for another.  Will treat with pain medication.  Follow  up with GI on Monday, return for fevers.      Carlisle Beers, MD 07/05/13 [phone]

## 2013-07-06 ENCOUNTER — Encounter: Payer: Self-pay | Admitting: Internal Medicine

## 2013-10-01 DIAGNOSIS — K529 Noninfective gastroenteritis and colitis, unspecified: Secondary | ICD-10-CM | POA: Insufficient documentation

## 2014-06-01 ENCOUNTER — Other Ambulatory Visit (HOSPITAL_COMMUNITY): Payer: Self-pay | Admitting: Obstetrics and Gynecology

## 2014-06-01 DIAGNOSIS — Z1231 Encounter for screening mammogram for malignant neoplasm of breast: Secondary | ICD-10-CM

## 2014-06-15 ENCOUNTER — Ambulatory Visit (HOSPITAL_COMMUNITY): Payer: 59

## 2014-06-26 ENCOUNTER — Ambulatory Visit (HOSPITAL_COMMUNITY): Payer: 59 | Attending: Obstetrics and Gynecology

## 2014-12-14 ENCOUNTER — Institutional Professional Consult (permissible substitution): Payer: 59 | Admitting: Internal Medicine

## 2014-12-22 ENCOUNTER — Ambulatory Visit
Admission: RE | Admit: 2014-12-22 | Discharge: 2014-12-22 | Disposition: A | Discharge status: Home / Self Care | Payer: 59 | Source: Ambulatory Visit | Attending: Gastroenterology | Admitting: Gastroenterology

## 2014-12-22 ENCOUNTER — Other Ambulatory Visit: Payer: Self-pay | Admitting: Gastroenterology

## 2014-12-22 DIAGNOSIS — R112 Nausea with vomiting, unspecified: Secondary | ICD-10-CM

## 2014-12-22 DIAGNOSIS — R1084 Generalized abdominal pain: Secondary | ICD-10-CM

## 2014-12-22 MED ORDER — IOPAMIDOL (ISOVUE-300) INJECTION 61%
125.0000 mL | Freq: Once | INTRAVENOUS | Status: AC | PRN
  Administered 2014-12-22: 125 mL via INTRAVENOUS

## 2015-03-31 IMAGING — CT CT ABD-PELV W/ CM
3 of 5 series · 13 of 36 positions shown, 19 images · IV contrast (READICAT/WATER & [ID] OMNI 300)
Comparison: 09/22/2010

CLINICAL DATA: Left lower quadrant pain for 4 days. Nausea. History
of diverticulitis.

EXAM:
CT ABDOMEN AND PELVIS WITH CONTRAST
TECHNIQUE: Multidetector CT imaging of the abdomen and pelvis was performed
using the standard protocol following bolus administration of
intravenous contrast.
CONTRAST:  125mL OMNIPAQUE IOHEXOL 300 MG/ML  SOLN

[Series 3: abd/pelvis with · axial · 0.82mm/px · z∈[-280,+5]mm · 6 of 78 slices shown, 11 images]
[im 12/78  soft-tissue]
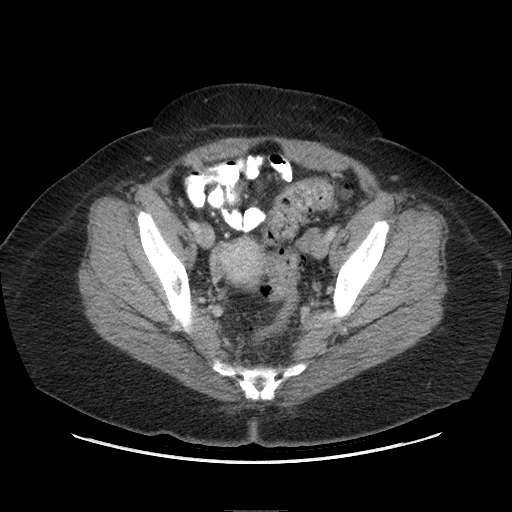
[im 12/78  bone]
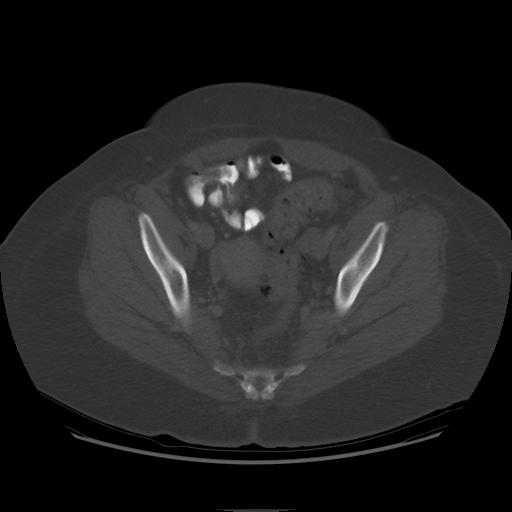
[im 23/78  soft-tissue]
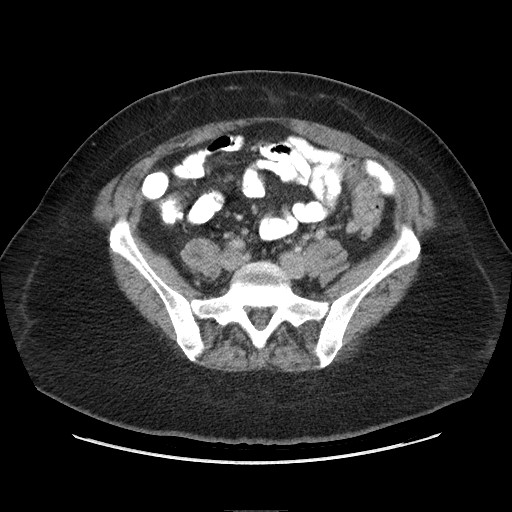
[im 34/78  soft-tissue]
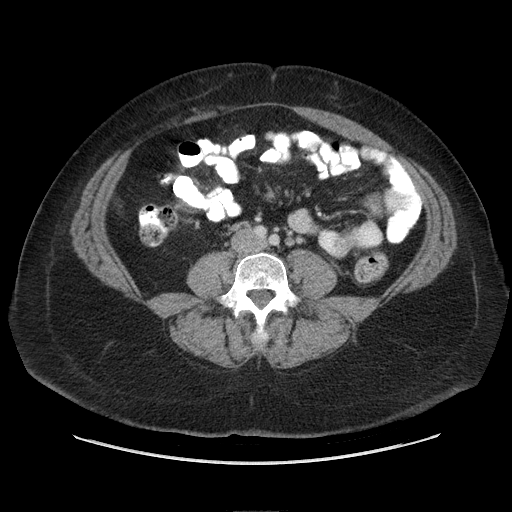
[im 34/78  lung]
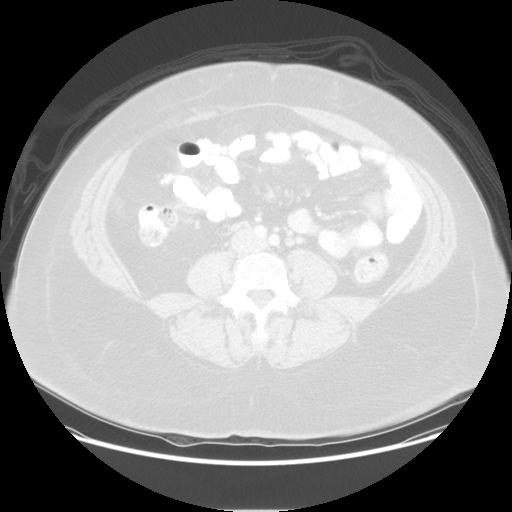
[im 45/78  soft-tissue]
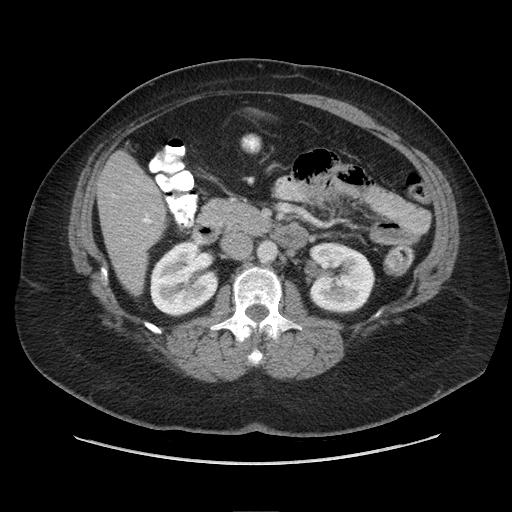
[im 45/78  lung]
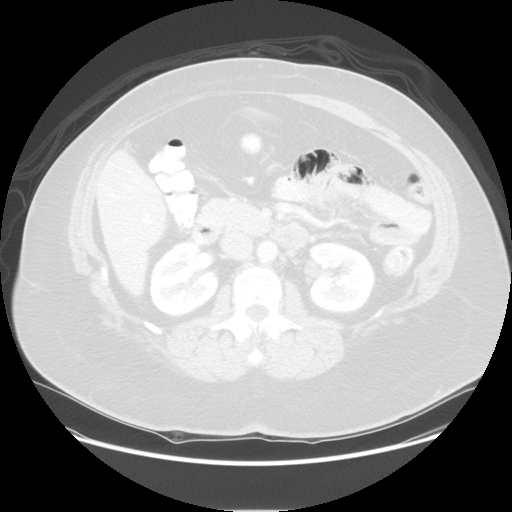
[im 56/78  soft-tissue]
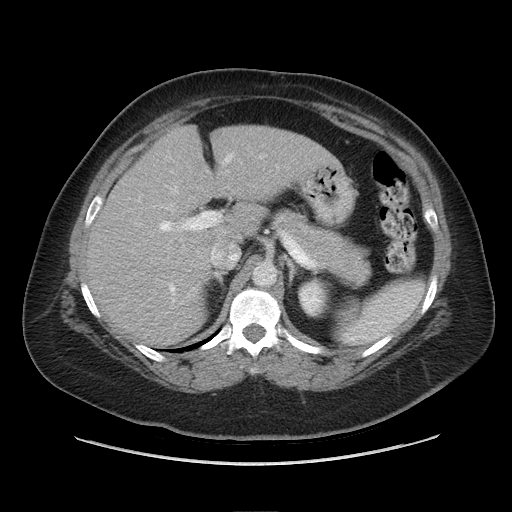
[im 56/78  lung]
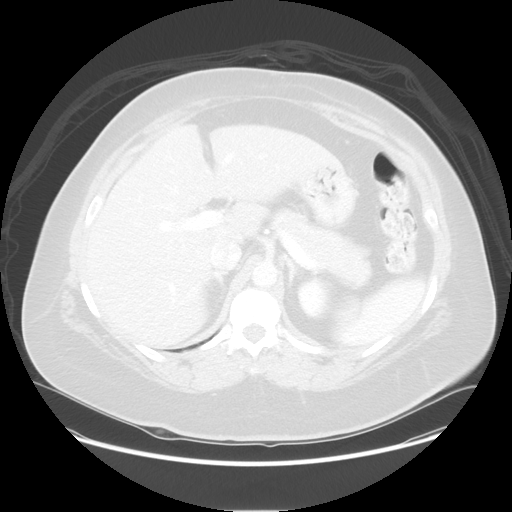
[im 67/78  soft-tissue]
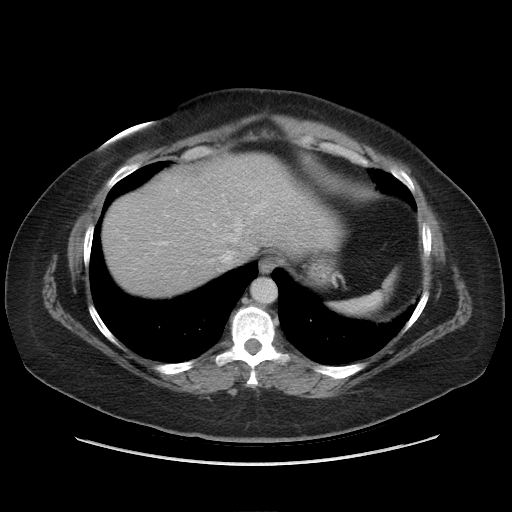
[im 67/78  lung]
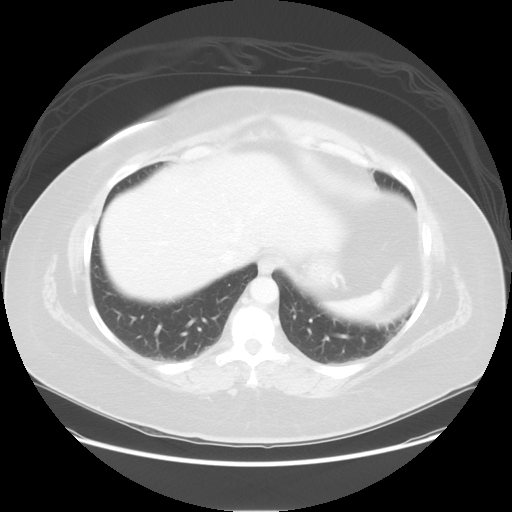

[Series 601: coronal body · coronal · 0.91mm/px · 1 of 131 slices shown, 2 images]
[im 44/131  soft-tissue]
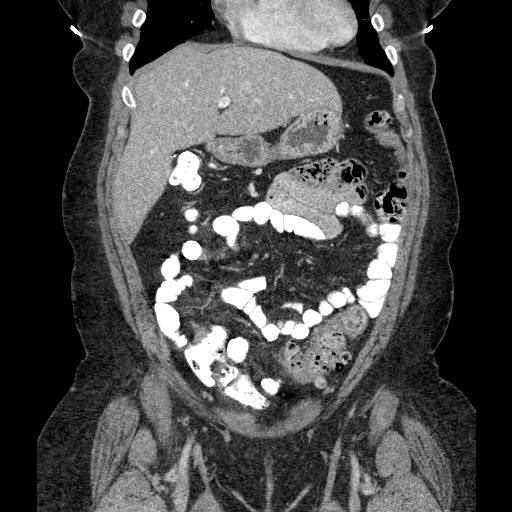
[im 44/131  bone]
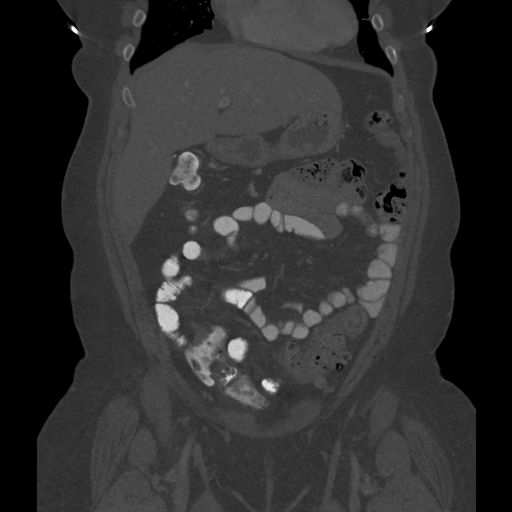

[Series 602: sagittal body · sagittal · 0.91mm/px · 6 of 168 slices shown]
[im 20/168  soft-tissue]
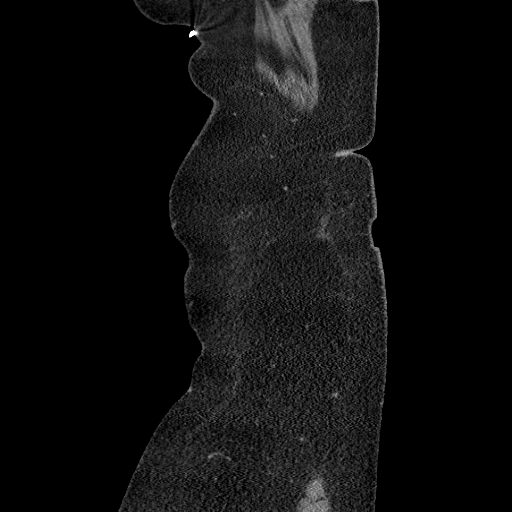
[im 40/168  soft-tissue]
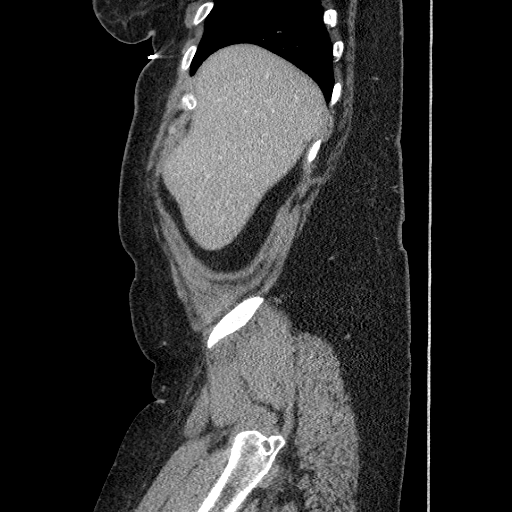
[im 59/168  soft-tissue]
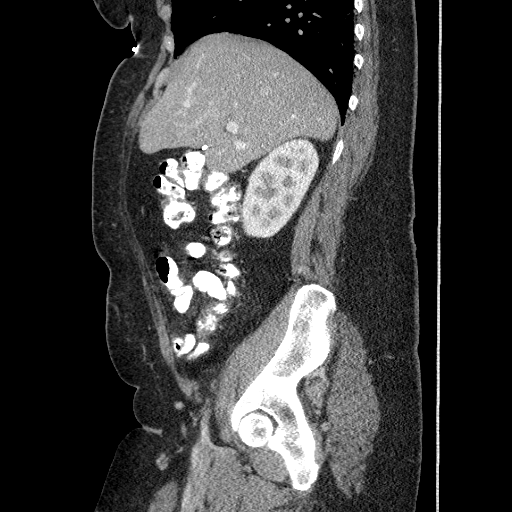
[im 79/168  soft-tissue]
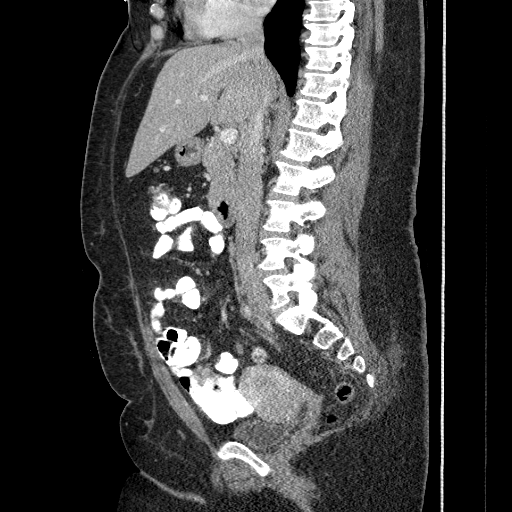
[im 99/168  soft-tissue]
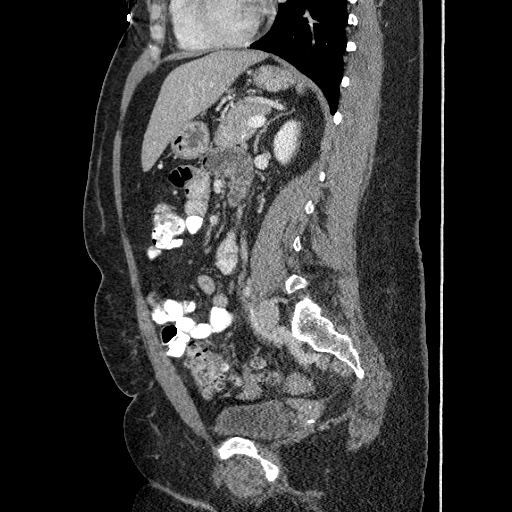
[im 118/168  soft-tissue]
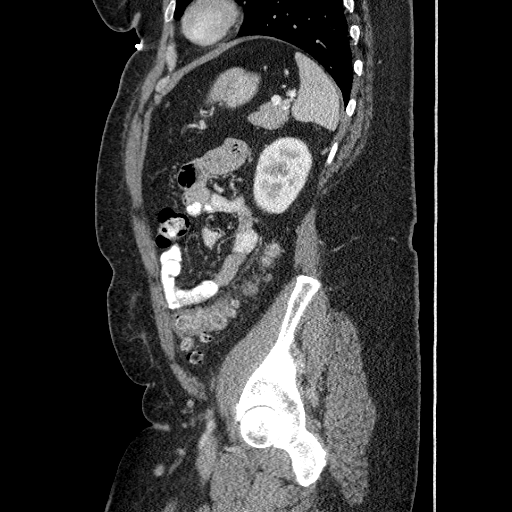

[13 of 36 positions shown; findings below may reference images not displayed]

FINDINGS: There are numerous sigmoid colon diverticula, but no findings of
diverticulitis. A few diverticula are noted along the descending
colon also without diverticulitis. The colon is otherwise
unremarkable. Normal small bowel. Normal appendix.

Lung bases are essentially clear.  Heart is normal in size.

Low-density lesion in the posterior segment of the right lobe of the
liver has increased in size to 12 mm. Is still likely a cyst but is
nonspecific. No other liver lesions. Liver otherwise unremarkable.

Normal spleen and pancreas. Status post cholecystectomy. No bile
duct dilation. Normal adrenal glands. Normal kidneys, ureters and
bladder.

4 cm fibroid arises from the right anterior aspect of the lower
uterine segment. This is stable. Uterus is otherwise unremarkable.
No adnexal masses.

There are no pathologically enlarged lymph nodes. No abnormal fluid
collections.

The bony structures are unremarkable.
IMPRESSION: 1. No acute findings.  No evidence of diverticulitis.
2. Multiple sigmoid colon diverticula, without acute inflammatory
changes.
3. Small low-density lesion in the posterior segment of the right
liver lobe has increased in size. It is still likely a cyst.
4. Status post cholecystectomy.
5. Uterine fibroid.
6. No other abnormalities.

## 2015-04-02 ENCOUNTER — Other Ambulatory Visit: Payer: Self-pay | Admitting: General Surgery

## 2015-04-02 DIAGNOSIS — R195 Other fecal abnormalities: Secondary | ICD-10-CM

## 2015-04-08 ENCOUNTER — Ambulatory Visit
Admission: RE | Admit: 2015-04-08 | Discharge: 2015-04-08 | Disposition: A | Discharge status: Home / Self Care | Payer: 59 | Source: Ambulatory Visit | Attending: General Surgery | Admitting: General Surgery

## 2015-04-08 DIAGNOSIS — R195 Other fecal abnormalities: Secondary | ICD-10-CM

## 2015-04-08 MED ORDER — IOPAMIDOL (ISOVUE-300) INJECTION 61%
125.0000 mL | Freq: Once | INTRAVENOUS | Status: AC | PRN
  Administered 2015-04-08: 125 mL via INTRAVENOUS

## 2017-03-06 DIAGNOSIS — M654 Radial styloid tenosynovitis [de Quervain]: Secondary | ICD-10-CM | POA: Insufficient documentation

## 2018-01-04 DIAGNOSIS — E785 Hyperlipidemia, unspecified: Secondary | ICD-10-CM | POA: Insufficient documentation

## 2018-01-04 LAB — HEPATIC FUNCTION PANEL
ALT: 13 (ref 7–35)
AST: 21 (ref 13–35)
Alkaline Phosphatase: 71 (ref 25–125)
BILIRUBIN, TOTAL: 0.7

## 2018-01-04 LAB — LIPID PANEL
CHOLESTEROL: 228 — AB (ref 0–200)
HDL: 56 (ref 35–70)
LDL Cholesterol: 147
LDL/HDL RATIO: 2.6
TRIGLYCERIDES: 123 (ref 40–160)

## 2018-01-04 LAB — BASIC METABOLIC PANEL
BUN: 16 (ref 4–21)
CREATININE: 0.8 (ref 0.5–1.1)
Glucose: 90
Potassium: 4.5 (ref 3.4–5.3)
SODIUM: 142 (ref 137–147)

## 2018-01-04 LAB — HEMOGLOBIN A1C: HEMOGLOBIN A1C: 5.3

## 2018-02-24 ENCOUNTER — Other Ambulatory Visit: Payer: Self-pay | Admitting: Internal Medicine

## 2018-03-20 ENCOUNTER — Other Ambulatory Visit: Payer: Self-pay | Admitting: Internal Medicine

## 2018-03-24 ENCOUNTER — Encounter: Payer: Self-pay | Admitting: Internal Medicine

## 2018-03-24 DIAGNOSIS — E785 Hyperlipidemia, unspecified: Secondary | ICD-10-CM

## 2018-03-26 ENCOUNTER — Encounter: Payer: 59 | Admitting: Internal Medicine

## 2018-05-14 ENCOUNTER — Encounter: Payer: Self-pay | Admitting: Internal Medicine

## 2018-07-09 ENCOUNTER — Ambulatory Visit (INDEPENDENT_AMBULATORY_CARE_PROVIDER_SITE_OTHER): Payer: 59 | Admitting: Internal Medicine

## 2018-07-09 ENCOUNTER — Other Ambulatory Visit: Payer: Self-pay

## 2018-07-09 ENCOUNTER — Encounter: Payer: Self-pay | Admitting: Internal Medicine

## 2018-07-09 VITALS — BP 116/78 | HR 66 | Temp 97.7°F | Ht 61.8 in | Wt 194.0 lb

## 2018-07-09 DIAGNOSIS — E041 Nontoxic single thyroid nodule: Secondary | ICD-10-CM | POA: Diagnosis not present

## 2018-07-09 DIAGNOSIS — Z0000 Encounter for general adult medical examination without abnormal findings: Secondary | ICD-10-CM | POA: Diagnosis not present

## 2018-07-09 DIAGNOSIS — Z23 Encounter for immunization: Secondary | ICD-10-CM | POA: Diagnosis not present

## 2018-07-09 LAB — POCT URINALYSIS DIPSTICK
Bilirubin, UA: NEGATIVE
Blood, UA: NEGATIVE
Glucose, UA: NEGATIVE
Ketones, UA: NEGATIVE
Leukocytes, UA: NEGATIVE
Nitrite, UA: NEGATIVE
PH UA: 7 (ref 5.0–8.0)
Protein, UA: NEGATIVE
Spec Grav, UA: 1.015 (ref 1.010–1.025)
UROBILINOGEN UA: 0.2 U/dL

## 2018-07-09 MED ORDER — TETANUS-DIPHTH-ACELL PERTUSSIS 5-2.5-18.5 LF-MCG/0.5 IM SUSP
0.5000 mL | Freq: Once | INTRAMUSCULAR | Status: AC
  Administered 2018-07-09: 0.5 mL via INTRAMUSCULAR

## 2018-07-09 MED ORDER — AZELASTINE HCL 0.1 % NA SOLN: NASAL | 1 refills | Status: DC

## 2018-07-09 MED ORDER — BUPROPION HCL ER (XL) 150 MG PO TB24: 150.0000 mg | ORAL_TABLET | Freq: Every day | ORAL | 2 refills | Status: DC

## 2018-07-09 NOTE — Patient Instructions (Signed)

## 2018-07-10 LAB — HEMOGLOBIN A1C
Est. average glucose Bld gHb Est-mCnc: 105 mg/dL
Hgb A1c MFr Bld: 5.3 % (ref 4.8–5.6)

## 2018-07-10 LAB — CMP14+EGFR
ALK PHOS: 75 IU/L (ref 39–117)
ALT: 11 IU/L (ref 0–32)
AST: 16 IU/L (ref 0–40)
Albumin/Globulin Ratio: 1.9 (ref 1.2–2.2)
Albumin: 4.7 g/dL (ref 3.8–4.9)
BILIRUBIN TOTAL: 0.4 mg/dL (ref 0.0–1.2)
BUN / CREAT RATIO: 14 (ref 9–23)
BUN: 11 mg/dL (ref 6–24)
CHLORIDE: 100 mmol/L (ref 96–106)
CO2: 25 mmol/L (ref 20–29)
Calcium: 9.7 mg/dL (ref 8.7–10.2)
Creatinine, Ser: 0.8 mg/dL (ref 0.57–1.00)
GFR calc Af Amer: 96 mL/min/{1.73_m2} (ref >59)
GFR calc non Af Amer: 83 mL/min/{1.73_m2} (ref >59)
GLOBULIN, TOTAL: 2.5 g/dL (ref 1.5–4.5)
GLUCOSE: 98 mg/dL (ref 65–99)
Potassium: 4.1 mmol/L (ref 3.5–5.2)
SODIUM: 141 mmol/L (ref 134–144)
Total Protein: 7.2 g/dL (ref 6.0–8.5)

## 2018-07-10 LAB — CBC
Hematocrit: 44.7 % (ref 34.0–46.6)
Hemoglobin: 15 g/dL (ref 11.1–15.9)
MCH: 31.3 pg (ref 26.6–33.0)
MCHC: 33.6 g/dL (ref 31.5–35.7)
MCV: 93 fL (ref 79–97)
Platelets: 355 10*3/uL (ref 150–450)
RBC: 4.79 x10E6/uL (ref 3.77–5.28)
RDW: 12.4 % (ref 11.7–15.4)
WBC: 6.7 10*3/uL (ref 3.4–10.8)

## 2018-07-10 LAB — HEPATITIS C ANTIBODY: Hep C Virus Ab: <0.1 s/co ratio (ref 0.0–0.9)

## 2018-07-10 LAB — TSH: TSH: 1.08 u[IU]/mL (ref 0.450–4.500)

## 2018-07-10 LAB — LIPID PANEL
Chol/HDL Ratio: 3.4 ratio (ref 0.0–4.4)
Cholesterol, Total: 209 mg/dL — ABNORMAL HIGH (ref 100–199)
HDL: 61 mg/dL (ref >39)
LDL CALC: 132 mg/dL — AB (ref 0–99)
Triglycerides: 81 mg/dL (ref 0–149)
VLDL CHOLESTEROL CAL: 16 mg/dL (ref 5–40)

## 2018-07-15 DIAGNOSIS — Z1231 Encounter for screening mammogram for malignant neoplasm of breast: Secondary | ICD-10-CM | POA: Diagnosis not present

## 2018-07-15 DIAGNOSIS — Z6834 Body mass index (BMI) 34.0-34.9, adult: Secondary | ICD-10-CM | POA: Diagnosis not present

## 2018-07-15 DIAGNOSIS — Z01419 Encounter for gynecological examination (general) (routine) without abnormal findings: Secondary | ICD-10-CM | POA: Diagnosis not present

## 2018-07-15 DIAGNOSIS — R69 Illness, unspecified: Secondary | ICD-10-CM | POA: Diagnosis not present

## 2018-07-16 NOTE — Progress Notes (Signed)
Subjective:     Patient ID: Danielle Silva , female    DOB: 04/18/1961 , 58 y.o.   MRN: [id number]   Chief Complaint  Patient presents with  . Annual Exam    HPI  She is here today for a full physical exam. She is followed by GYN for her pelvic exams. She has no specific concerns or complaints at this time.     Past Medical History:  Diagnosis Date  . Anxiety   . Depression   . Diverticulitis      Family History  Problem Relation Age of Onset  . Diabetes Mother   . Hypertension Mother   . Hyperlipidemia Mother   . Heart disease Mother        CABG  . Hypertension Father   . Kidney disease Father        kidney transplant  . Thyroid disease Father   . Heart disease Father        A-fib  . Arthritis Brother      Current Outpatient Medications:  .  azelastine (ASTELIN) 0.1 % nasal spray, Use 2 sprays  in each nostril as directed, Disp: 90 mL, Rfl: 1 .  buPROPion (WELLBUTRIN XL) 150 MG 24 hr tablet, Take 1 tablet (150 mg total) by mouth daily., Disp: 90 tablet, Rfl: 2 .  calcium carbonate (OS-CAL) 600 MG TABS tablet, Take 600 mg by mouth 2 (two) times daily with a meal., Disp: , Rfl:  .  cetirizine (ZYRTEC) 5 MG tablet, Take 5 mg by mouth daily., Disp: , Rfl:  .  Cholecalciferol (VITAMIN D3) 5000 units CAPS, Take by mouth., Disp: , Rfl:  .  Multiple Vitamin (MULTIVITAMIN WITH MINERALS) TABS tablet, Take 1 tablet by mouth daily., Disp: , Rfl:    Allergies  Allergen Reactions  . Macrobid [Nitrofurantoin Monohydrate Macrocrystals]   . Nsaids   . Percocet [Oxycodone-Acetaminophen]      Patient's last menstrual period was 01/05/2012.. . Negative for: breast discharge, breast lump(s), breast pain and breast self exam. Associated symptoms include abnormal vaginal bleeding. Pertinent negatives include abnormal bleeding (hematology), anxiety, decreased libido, depression, difficulty falling sleep, dyspareunia, history of infertility, nocturia, sexual dysfunction, sleep  disturbances, urinary incontinence, urinary urgency, vaginal discharge and vaginal itching. Diet regular.The patient states her exercise level is  moderate.   . The patient's tobacco use is:  Social History   Tobacco Use  Smoking Status Former Smoker  . Years: 15.00  Smokeless Tobacco Never Used  . She has been exposed to passive smoke. The patient's alcohol use is:  Social History   Substance and Sexual Activity  Alcohol Use Yes   Comment: occ    Review of Systems  Constitutional: Negative.   HENT: Negative.   Eyes: Negative.   Respiratory: Negative.   Cardiovascular: Negative.   Gastrointestinal: Negative.   Endocrine: Negative.   Genitourinary: Negative.   Musculoskeletal: Negative.   Skin: Negative.   Allergic/Immunologic: Negative.   Neurological: Negative.   Hematological: Negative.   Psychiatric/Behavioral: Negative.      Today's Vitals   07/09/18 1101  BP: 116/78  Pulse: 66  Temp: 97.7 F (36.5 C)  TempSrc: Oral  Weight: 194 lb (88 kg)  Height: 5' 1.8" (1.57 m)   Body mass index is 35.71 kg/m.   Objective:  Physical Exam Vitals signs and nursing note reviewed.  Constitutional:      Appearance: Normal appearance.  HENT:     Head: Normocephalic and atraumatic.  Right Ear: Tympanic membrane, ear canal and external ear normal.     Left Ear: Tympanic membrane, ear canal and external ear normal.     Nose: Nose normal.     Mouth/Throat:     Mouth: Mucous membranes are moist.     Pharynx: Oropharynx is clear.  Eyes:     Extraocular Movements: Extraocular movements intact.     Conjunctiva/sclera: Conjunctivae normal.     Pupils: Pupils are equal, round, and reactive to light.  Neck:     Musculoskeletal: Normal range of motion and neck supple.     Comments: Thyroid nodule Cardiovascular:     Rate and Rhythm: Normal rate and regular rhythm.     Pulses: Normal pulses.     Heart sounds: Normal heart sounds.  Pulmonary:     Effort: Pulmonary effort  is normal.     Breath sounds: Normal breath sounds.  Chest:     Breasts:        Right: Normal. No swelling, bleeding, inverted nipple, mass or nipple discharge.        Left: Normal. No swelling, bleeding, inverted nipple, mass or nipple discharge.  Abdominal:     General: Bowel sounds are normal.     Palpations: Abdomen is soft.  Genitourinary:    Comments: deferred Musculoskeletal: Normal range of motion.  Skin:    General: Skin is warm.  Neurological:     General: No focal deficit present.     Mental Status: She is alert and oriented to person, place, and time.  Psychiatric:        Mood and Affect: Mood normal.        Behavior: Behavior normal.         Assessment And Plan:     1. Routine general medical examination at health care facility  A full exam was performed.  Importance of monthly self breast exams was discussed with the patient. PATIENT HAS BEEN ADVISED TO GET 30-45 MINUTES REGULAR EXERCISE NO LESS THAN FOUR TO FIVE DAYS PER WEEK - BOTH WEIGHTBEARING EXERCISES AND AEROBIC ARE RECOMMENDED.  SHE IS ADVISED TO FOLLOW A HEALTHY DIET WITH AT LEAST SIX FRUITS/VEGGIES PER DAY, DECREASE INTAKE OF RED MEAT, AND TO INCREASE FISH INTAKE TO TWO DAYS PER WEEK.  MEATS/FISH SHOULD NOT BE FRIED, BAKED OR BROILED IS PREFERABLE.  I SUGGEST WEARING SPF 50 SUNSCREEN ON EXPOSED PARTS AND ESPECIALLY WHEN IN THE DIRECT SUNLIGHT FOR AN EXTENDED PERIOD OF TIME.  PLEASE AVOID FAST FOOD RESTAURANTS AND INCREASE YOUR WATER INTAKE.  - CMP14+EGFR - CBC - Lipid panel - Hemoglobin A1c - Hepatitis C antibody - TSH - POCT Urinalysis Dipstick (81002)  2. Thyroid nodule  I will refer her for thyroid u/s. I will make further recommendations once her labs are available for review.   - US Soft Tissue Head/Neck; Future  3. Need for vaccination  - Tdap (BOOSTRIX) injection 0.5 mL        [name], MD

## 2018-09-17 DIAGNOSIS — J0100 Acute maxillary sinusitis, unspecified: Secondary | ICD-10-CM | POA: Diagnosis not present

## 2018-12-19 DIAGNOSIS — R1032 Left lower quadrant pain: Secondary | ICD-10-CM | POA: Diagnosis not present

## 2018-12-19 DIAGNOSIS — K5732 Diverticulitis of large intestine without perforation or abscess without bleeding: Secondary | ICD-10-CM | POA: Diagnosis not present

## 2018-12-19 DIAGNOSIS — K219 Gastro-esophageal reflux disease without esophagitis: Secondary | ICD-10-CM | POA: Diagnosis not present

## 2018-12-19 DIAGNOSIS — K52832 Lymphocytic colitis: Secondary | ICD-10-CM | POA: Diagnosis not present

## 2019-01-10 ENCOUNTER — Other Ambulatory Visit: Payer: Self-pay | Admitting: Internal Medicine

## 2019-01-27 ENCOUNTER — Other Ambulatory Visit: Payer: Self-pay | Admitting: Internal Medicine

## 2019-05-05 DIAGNOSIS — U071 COVID-19: Secondary | ICD-10-CM | POA: Diagnosis not present

## 2019-05-05 DIAGNOSIS — Z1159 Encounter for screening for other viral diseases: Secondary | ICD-10-CM | POA: Diagnosis not present

## 2019-05-07 DIAGNOSIS — R0981 Nasal congestion: Secondary | ICD-10-CM | POA: Diagnosis not present

## 2019-05-07 DIAGNOSIS — U071 COVID-19: Secondary | ICD-10-CM | POA: Diagnosis not present

## 2019-05-07 DIAGNOSIS — R0602 Shortness of breath: Secondary | ICD-10-CM | POA: Diagnosis not present

## 2019-05-07 DIAGNOSIS — R05 Cough: Secondary | ICD-10-CM | POA: Diagnosis not present

## 2019-05-27 ENCOUNTER — Other Ambulatory Visit: Payer: Self-pay | Admitting: Internal Medicine

## 2019-06-26 ENCOUNTER — Other Ambulatory Visit: Payer: Self-pay | Admitting: Internal Medicine

## 2019-07-11 ENCOUNTER — Other Ambulatory Visit: Payer: Self-pay | Admitting: Internal Medicine

## 2019-07-15 ENCOUNTER — Encounter: Payer: 59 | Admitting: Internal Medicine

## 2019-08-19 ENCOUNTER — Ambulatory Visit (INDEPENDENT_AMBULATORY_CARE_PROVIDER_SITE_OTHER): Payer: 59 | Admitting: Internal Medicine

## 2019-08-19 ENCOUNTER — Other Ambulatory Visit: Payer: Self-pay

## 2019-08-19 ENCOUNTER — Encounter: Payer: Self-pay | Admitting: Internal Medicine

## 2019-08-19 VITALS — BP 108/66 | HR 70 | Temp 98.4°F | Ht 61.2 in | Wt 200.0 lb

## 2019-08-19 DIAGNOSIS — E559 Vitamin D deficiency, unspecified: Secondary | ICD-10-CM | POA: Diagnosis not present

## 2019-08-19 DIAGNOSIS — F4321 Adjustment disorder with depressed mood: Secondary | ICD-10-CM

## 2019-08-19 DIAGNOSIS — Z6835 Body mass index (BMI) 35.0-35.9, adult: Secondary | ICD-10-CM | POA: Diagnosis not present

## 2019-08-19 DIAGNOSIS — E6609 Other obesity due to excess calories: Secondary | ICD-10-CM

## 2019-08-19 DIAGNOSIS — Z6837 Body mass index (BMI) 37.0-37.9, adult: Secondary | ICD-10-CM | POA: Diagnosis not present

## 2019-08-19 DIAGNOSIS — R69 Illness, unspecified: Secondary | ICD-10-CM | POA: Diagnosis not present

## 2019-08-19 DIAGNOSIS — Z0000 Encounter for general adult medical examination without abnormal findings: Secondary | ICD-10-CM | POA: Diagnosis not present

## 2019-08-19 DIAGNOSIS — Z01419 Encounter for gynecological examination (general) (routine) without abnormal findings: Secondary | ICD-10-CM | POA: Diagnosis not present

## 2019-08-19 DIAGNOSIS — Z1231 Encounter for screening mammogram for malignant neoplasm of breast: Secondary | ICD-10-CM | POA: Diagnosis not present

## 2019-08-19 LAB — HM PAP SMEAR

## 2019-08-19 LAB — HM MAMMOGRAPHY

## 2019-08-19 MED ORDER — RYBELSUS 3 MG PO TABS: 3.0000 mg | ORAL_TABLET | Freq: Every day | ORAL | 0 refills | Status: DC

## 2019-08-19 NOTE — Progress Notes (Signed)
This visit occurred during the SARS-CoV-2 public health emergency.  Safety protocols were in place, including screening questions prior to the visit, additional usage of staff PPE, and extensive cleaning of exam room while observing appropriate contact time as indicated for disinfecting solutions.  Subjective:     Patient ID: Danielle Silva , female    DOB: 1961/03/12 , 59 y.o.   MRN: [id number]   Chief Complaint  Patient presents with  . Annual Exam    HPI  She is here today for a full physical examination. She is followed by Dr. Garwin Brothers for her GYN exam. She was last seen today. She had mammogram performed today. Unfortunately, her son was killed since her last visit. She reports he was on a moped and was hit by a truck. Shortly thereafter, her mother in law passed.     Past Medical History:  Diagnosis Date  . Anxiety   . Depression   . Diverticulitis      Family History  Problem Relation Age of Onset  . Diabetes Mother   . Hypertension Mother   . Hyperlipidemia Mother   . Heart disease Mother        CABG  . Hypertension Father   . Kidney disease Father        kidney transplant  . Thyroid disease Father   . Heart disease Father        A-fib  . Arthritis Brother      Current Outpatient Medications:  .  Azelastine HCl 137 MCG/SPRAY SOLN, SPRAY 2 SPRAYS IN EACH NOSTRIL AS DIRECTED, Disp: 30 mL, Rfl: 1 .  buPROPion (WELLBUTRIN XL) 150 MG 24 hr tablet, TAKE 1 TABLET BY MOUTH EVERY DAY, Disp: 90 tablet, Rfl: 2 .  calcium carbonate (OS-CAL) 600 MG TABS tablet, Take 600 mg by mouth daily. , Disp: , Rfl:  .  cetirizine (ZYRTEC) 5 MG tablet, Take 5 mg by mouth daily., Disp: , Rfl:  .  Cholecalciferol (VITAMIN D3) 5000 units CAPS, Take by mouth., Disp: , Rfl:  .  Multiple Vitamin (MULTIVITAMIN WITH MINERALS) TABS tablet, Take 1 tablet by mouth daily., Disp: , Rfl:    Allergies  Allergen Reactions  . Macrobid [Nitrofurantoin Monohydrate Macrocrystals]   . Nsaids   .  Percocet [Oxycodone-Acetaminophen]      The patient states she uses none for birth control. Last LMP was Patient's last menstrual period was 01/05/2012.. Negative for Dysmenorrhea  Negative for: breast discharge, breast lump(s), breast pain and breast self exam. Associated symptoms include abnormal vaginal bleeding. Pertinent negatives include abnormal bleeding (hematology), anxiety, decreased libido, depression, difficulty falling sleep, dyspareunia, history of infertility, nocturia, sexual dysfunction, sleep disturbances, urinary incontinence, urinary urgency, vaginal discharge and vaginal itching. Diet regular.The patient states her exercise level is  intermittent.   . The patient's tobacco use is:  Social History   Tobacco Use  Smoking Status Former Smoker  . Years: 15.00  Smokeless Tobacco Never Used  . She has been exposed to passive smoke. The patient's alcohol use is:  Social History   Substance and Sexual Activity  Alcohol Use Yes   Comment: occ    Review of Systems  Constitutional: Negative.   HENT: Negative.   Eyes: Negative.   Respiratory: Negative.   Cardiovascular: Negative.   Endocrine: Negative.   Genitourinary: Negative.   Musculoskeletal: Negative.   Skin: Negative.   Allergic/Immunologic: Negative.   Neurological: Negative.   Hematological: Negative.   Psychiatric/Behavioral: Negative.      Today's  Vitals   08/19/19 1442  BP: 108/66  Pulse: 70  Temp: 98.4 F (36.9 C)  TempSrc: Oral  Weight: 200 lb (90.7 kg)  Height: 5' 1.2" (1.554 m)   Body mass index is 37.54 kg/m.   Objective:  Physical Exam Vitals and nursing note reviewed.  Constitutional:      Appearance: Normal appearance. She is obese.  HENT:     Head: Normocephalic and atraumatic.     Right Ear: Tympanic membrane, ear canal and external ear normal.     Left Ear: Tympanic membrane, ear canal and external ear normal.     Nose:     Comments: Deferred, masked    Mouth/Throat:      Comments: Deferred, masked Eyes:     Extraocular Movements: Extraocular movements intact.     Conjunctiva/sclera: Conjunctivae normal.     Pupils: Pupils are equal, round, and reactive to light.  Cardiovascular:     Rate and Rhythm: Normal rate and regular rhythm.     Pulses: Normal pulses.     Heart sounds: Normal heart sounds.  Pulmonary:     Effort: Pulmonary effort is normal.     Breath sounds: Normal breath sounds.  Chest:     Breasts: Tanner Score is 5.        Right: Normal.        Left: Normal.  Abdominal:     General: Bowel sounds are normal.     Palpations: Abdomen is soft.     Comments: Rounded, soft.   Genitourinary:    Comments: deferred Musculoskeletal:        General: Normal range of motion.     Cervical back: Normal range of motion and neck supple.  Skin:    General: Skin is warm and dry.     Comments: Scattered tattoos.   Neurological:     General: No focal deficit present.     Mental Status: She is alert and oriented to person, place, and time.  Psychiatric:        Mood and Affect: Mood normal.        Behavior: Behavior normal.         Assessment And Plan:     1. Routine general medical examination at health care facility  A full exam was performed.  Importance of monthly self breast exams was discussed with the patient. PATIENT IS ADVISED TO GET 30-45 MINUTES REGULAR EXERCISE NO LESS THAN FOUR TO FIVE DAYS PER WEEK - BOTH WEIGHTBEARING EXERCISES AND AEROBIC ARE RECOMMENDED.  SHE IS ADVISED TO FOLLOW A HEALTHY DIET WITH AT LEAST SIX FRUITS/VEGGIES PER DAY, DECREASE INTAKE OF RED MEAT, AND TO INCREASE FISH INTAKE TO TWO DAYS PER WEEK.  MEATS/FISH SHOULD NOT BE FRIED, BAKED OR BROILED IS PREFERABLE.  I SUGGEST WEARING SPF 50 SUNSCREEN ON EXPOSED PARTS AND ESPECIALLY WHEN IN THE DIRECT SUNLIGHT FOR AN EXTENDED PERIOD OF TIME.  PLEASE AVOID FAST FOOD RESTAURANTS AND INCREASE YOUR WATER INTAKE.  - CMP14+EGFR - CBC - Lipid panel - Hemoglobin A1c - Insulin,  random(561) - TSH - Vitamin D (25 hydroxy)  2. Vitamin D deficiency disease  I WILL CHECK A VIT D LEVEL AND SUPPLEMENT AS NEEDED.  ALSO ENCOURAGED TO SPEND 15 MINUTES IN THE SUN DAILY.   3. Class 2 obesity due to excess calories without serious comorbidity with body mass index (BMI) of 37.0 to 37.9 in adult  We discussed the use of pharmacologic agents to help with weight loss.  She denies  family history of thyroid cancer. Possible side effects of Rybelsus was discussed with the patient. She was advised to consider Bluesky by her GYN, but she prefers to try rx meds first. Dosing schedule was discussed with the patient. She will start with 68m once daily upon awakening. She will rto in six weeks for re-evaluation.   4. Grief  She declines grief counseling at this time.    RMaximino Greenland MD    THE PATIENT IS ENCOURAGED TO PRACTICE SOCIAL DISTANCING DUE TO THE COVID-19 PANDEMIC.

## 2019-08-19 NOTE — Patient Instructions (Signed)
Health Maintenance, Female Adopting a healthy lifestyle and getting preventive care are important in promoting health and wellness. Ask your health care provider about:  The right schedule for you to have regular tests and exams.  Things you can do on your own to prevent diseases and keep yourself healthy. What should I know about diet, weight, and exercise? Eat a healthy diet   Eat a diet that includes plenty of vegetables, fruits, low-fat dairy products, and lean protein.  Do not eat a lot of foods that are high in solid fats, added sugars, or sodium. Maintain a healthy weight Body mass index (BMI) is used to identify weight problems. It estimates body fat based on height and weight. Your health care provider can help determine your BMI and help you achieve or maintain a healthy weight. Get regular exercise Get regular exercise. This is one of the most important things you can do for your health. Most adults should:  Exercise for at least 150 minutes each week. The exercise should increase your heart rate and make you sweat (moderate-intensity exercise).  Do strengthening exercises at least twice a week. This is in addition to the moderate-intensity exercise.  Spend less time sitting. Even light physical activity can be beneficial. Watch cholesterol and blood lipids Have your blood tested for lipids and cholesterol at 57 years of age, then have this test every 5 years. Have your cholesterol levels checked more often if:  Your lipid or cholesterol levels are high.  You are older than 57 years of age.  You are at high risk for heart disease. What should I know about cancer screening? Depending on your health history and family history, you may need to have cancer screening at various ages. This may include screening for:  Breast cancer.  Cervical cancer.  Colorectal cancer.  Skin cancer.  Lung cancer. What should I know about heart disease, diabetes, and high blood  pressure? Blood pressure and heart disease  High blood pressure causes heart disease and increases the risk of stroke. This is more likely to develop in people who have high blood pressure readings, are of African descent, or are overweight.  Have your blood pressure checked: ? Every 3-5 years if you are 18-39 years of age. ? Every year if you are 40 years old or older. Diabetes Have regular diabetes screenings. This checks your fasting blood sugar level. Have the screening done:  Once every three years after age 40 if you are at a normal weight and have a low risk for diabetes.  More often and at a younger age if you are overweight or have a high risk for diabetes. What should I know about preventing infection? Hepatitis B If you have a higher risk for hepatitis B, you should be screened for this virus. Talk with your health care provider to find out if you are at risk for hepatitis B infection. Hepatitis C Testing is recommended for:  Everyone born from 1945 through 1965.  Anyone with known risk factors for hepatitis C. Sexually transmitted infections (STIs)  Get screened for STIs, including gonorrhea and chlamydia, if: ? You are sexually active and are younger than 57 years of age. ? You are older than 57 years of age and your health care provider tells you that you are at risk for this type of infection. ? Your sexual activity has changed since you were last screened, and you are at increased risk for chlamydia or gonorrhea. Ask your health care provider if   you are at risk.  Ask your health care provider about whether you are at high risk for HIV. Your health care provider may recommend a prescription medicine to help prevent HIV infection. If you choose to take medicine to prevent HIV, you should first get tested for HIV. You should then be tested every 3 months for as long as you are taking the medicine. Pregnancy  If you are about to stop having your period (premenopausal) and  you may become pregnant, seek counseling before you get pregnant.  Take 400 to 800 micrograms (mcg) of folic acid every day if you become pregnant.  Ask for birth control (contraception) if you want to prevent pregnancy. Osteoporosis and menopause Osteoporosis is a disease in which the bones lose minerals and strength with aging. This can result in bone fractures. If you are 65 years old or older, or if you are at risk for osteoporosis and fractures, ask your health care provider if you should:  Be screened for bone loss.  Take a calcium or vitamin D supplement to lower your risk of fractures.  Be given hormone replacement therapy (HRT) to treat symptoms of menopause. Follow these instructions at home: Lifestyle  Do not use any products that contain nicotine or tobacco, such as cigarettes, e-cigarettes, and chewing tobacco. If you need help quitting, ask your health care provider.  Do not use street drugs.  Do not share needles.  Ask your health care provider for help if you need support or information about quitting drugs. Alcohol use  Do not drink alcohol if: ? Your health care provider tells you not to drink. ? You are pregnant, may be pregnant, or are planning to become pregnant.  If you drink alcohol: ? Limit how much you use to 0-1 drink a day. ? Limit intake if you are breastfeeding.  Be aware of how much alcohol is in your drink. In the [place] one drink equals one 12 oz bottle of beer (355 mL), one 5 oz glass of wine (148 mL), or one 1 oz glass of hard liquor (44 mL). General instructions  Schedule regular health, dental, and eye exams.  Stay current with your vaccines.  Tell your health care provider if: ? You often feel depressed. ? You have ever been abused or do not feel safe at home. Summary  Adopting a healthy lifestyle and getting preventive care are important in promoting health and wellness.  Follow your health care provider's instructions about healthy  diet, exercising, and getting tested or screened for diseases.  Follow your health care provider's instructions on monitoring your cholesterol and blood pressure. This information is not intended to replace advice given to you by your health care provider. Make sure you discuss any questions you have with your health care provider. Document Revised: 05/01/2018 Document Reviewed: 05/01/2018 Elsevier Patient Education  2020 [employer]  

## 2019-08-20 LAB — CMP14+EGFR
ALT: 9 IU/L (ref 0–32)
AST: 18 IU/L (ref 0–40)
Albumin/Globulin Ratio: 1.8 (ref 1.2–2.2)
Albumin: 4.7 g/dL (ref 3.8–4.9)
Alkaline Phosphatase: 71 IU/L (ref 39–117)
BUN/Creatinine Ratio: 11 (ref 9–23)
BUN: 9 mg/dL (ref 6–24)
Bilirubin Total: 0.8 mg/dL (ref 0.0–1.2)
CO2: 20 mmol/L (ref 20–29)
Calcium: 9.7 mg/dL (ref 8.7–10.2)
Chloride: 100 mmol/L (ref 96–106)
Creatinine, Ser: 0.81 mg/dL (ref 0.57–1.00)
GFR calc Af Amer: 94 mL/min/{1.73_m2} (ref >59)
GFR calc non Af Amer: 81 mL/min/{1.73_m2} (ref >59)
Globulin, Total: 2.6 g/dL (ref 1.5–4.5)
Glucose: 82 mg/dL (ref 65–99)
Potassium: 4.1 mmol/L (ref 3.5–5.2)
Sodium: 142 mmol/L (ref 134–144)
Total Protein: 7.3 g/dL (ref 6.0–8.5)

## 2019-08-20 LAB — HEMOGLOBIN A1C
Est. average glucose Bld gHb Est-mCnc: 100 mg/dL
Hgb A1c MFr Bld: 5.1 % (ref 4.8–5.6)

## 2019-08-20 LAB — LIPID PANEL
Chol/HDL Ratio: 4.4 ratio (ref 0.0–4.4)
Cholesterol, Total: 258 mg/dL — ABNORMAL HIGH (ref 100–199)
HDL: 59 mg/dL (ref >39)
LDL Chol Calc (NIH): 178 mg/dL — ABNORMAL HIGH (ref 0–99)
Triglycerides: 117 mg/dL (ref 0–149)
VLDL Cholesterol Cal: 21 mg/dL (ref 5–40)

## 2019-08-20 LAB — CBC
Hematocrit: 42.4 % (ref 34.0–46.6)
Hemoglobin: 14.3 g/dL (ref 11.1–15.9)
MCH: 31.6 pg (ref 26.6–33.0)
MCHC: 33.7 g/dL (ref 31.5–35.7)
MCV: 94 fL (ref 79–97)
Platelets: 326 10*3/uL (ref 150–450)
RBC: 4.52 x10E6/uL (ref 3.77–5.28)
RDW: 12.9 % (ref 11.7–15.4)
WBC: 7.3 10*3/uL (ref 3.4–10.8)

## 2019-08-20 LAB — INSULIN, RANDOM: INSULIN: 6 u[IU]/mL (ref 2.6–24.9)

## 2019-08-20 LAB — VITAMIN D 25 HYDROXY (VIT D DEFICIENCY, FRACTURES): Vit D, 25-Hydroxy: 64 ng/mL (ref 30.0–100.0)

## 2019-08-20 LAB — TSH: TSH: 1.14 u[IU]/mL (ref 0.450–4.500)

## 2019-08-26 DIAGNOSIS — K5730 Diverticulosis of large intestine without perforation or abscess without bleeding: Secondary | ICD-10-CM | POA: Diagnosis not present

## 2019-08-26 DIAGNOSIS — R1032 Left lower quadrant pain: Secondary | ICD-10-CM | POA: Diagnosis not present

## 2019-09-30 ENCOUNTER — Ambulatory Visit (INDEPENDENT_AMBULATORY_CARE_PROVIDER_SITE_OTHER): Payer: 59 | Admitting: Internal Medicine

## 2019-09-30 ENCOUNTER — Encounter: Payer: Self-pay | Admitting: Internal Medicine

## 2019-09-30 ENCOUNTER — Other Ambulatory Visit: Payer: Self-pay

## 2019-09-30 VITALS — BP 116/72 | HR 76 | Temp 98.5°F | Ht 63.0 in | Wt 192.4 lb

## 2019-09-30 DIAGNOSIS — Z8616 Personal history of COVID-19: Secondary | ICD-10-CM

## 2019-09-30 DIAGNOSIS — E6609 Other obesity due to excess calories: Secondary | ICD-10-CM

## 2019-09-30 DIAGNOSIS — Z6834 Body mass index (BMI) 34.0-34.9, adult: Secondary | ICD-10-CM | POA: Diagnosis not present

## 2019-10-11 NOTE — Progress Notes (Signed)
This visit occurred during the SARS-CoV-2 public health emergency.  Safety protocols were in place, including screening questions prior to the visit, additional usage of staff PPE, and extensive cleaning of exam room while observing appropriate contact time as indicated for disinfecting solutions.  Subjective:     Patient ID: Danielle Silva , female    DOB: 1961-03-09 , 59 y.o.   MRN: [id number]   Chief Complaint  Patient presents with  . Weight Check    HPI  She is here today for weight check.  She was started on Rybelsus to address suspected insulin resistance and obesity. She reaffirms that she does not have family h/o thyroid cancer. She has not had any issues with the medication. She has noticed decreased appetite.     Past Medical History:  Diagnosis Date  . Anxiety   . Depression   . Diverticulitis      Family History  Problem Relation Age of Onset  . Diabetes Mother   . Hypertension Mother   . Hyperlipidemia Mother   . Heart disease Mother        CABG  . Hypertension Father   . Kidney disease Father        kidney transplant  . Thyroid disease Father   . Heart disease Father        A-fib  . Arthritis Brother      Current Outpatient Medications:  .  Azelastine HCl 137 MCG/SPRAY SOLN, SPRAY 2 SPRAYS IN EACH NOSTRIL AS DIRECTED, Disp: 30 mL, Rfl: 1 .  buPROPion (WELLBUTRIN XL) 150 MG 24 hr tablet, TAKE 1 TABLET BY MOUTH EVERY DAY, Disp: 90 tablet, Rfl: 2 .  cetirizine (ZYRTEC) 5 MG tablet, Take 5 mg by mouth daily., Disp: , Rfl:  .  Cholecalciferol (VITAMIN D3) 5000 units CAPS, Take by mouth., Disp: , Rfl:  .  Multiple Vitamin (MULTIVITAMIN WITH MINERALS) TABS tablet, Take 1 tablet by mouth daily., Disp: , Rfl:  .  Semaglutide (RYBELSUS) 3 MG TABS, Take 3 mg by mouth daily., Disp: 30 tablet, Rfl: 0 .  calcium carbonate (OS-CAL) 600 MG TABS tablet, Take 600 mg by mouth daily. , Disp: , Rfl:    Allergies  Allergen Reactions  . Macrobid [Nitrofurantoin  Monohydrate Macrocrystals]   . Nsaids   . Percocet [Oxycodone-Acetaminophen]      Review of Systems  Constitutional: Negative.   Respiratory: Negative.        She reports having COVID in Dec. She did not require ER eval or hospitalization.   Cardiovascular: Negative.   Gastrointestinal: Negative.   Neurological: Negative.   Psychiatric/Behavioral: Negative.      Today's Vitals   09/30/19 0947  BP: 116/72  Pulse: 76  Temp: 98.5 F (36.9 C)  TempSrc: Oral  Weight: 192 lb 6.4 oz (87.3 kg)  Height: 5\' 3"  (1.6 m)   Body mass index is 34.08 kg/m.   Objective:  Physical Exam Vitals and nursing note reviewed.  Constitutional:      Appearance: Normal appearance.  HENT:     Head: Normocephalic and atraumatic.  Cardiovascular:     Rate and Rhythm: Normal rate and regular rhythm.     Heart sounds: Normal heart sounds.  Pulmonary:     Effort: Pulmonary effort is normal.     Breath sounds: Normal breath sounds.  Skin:    General: Skin is warm.  Neurological:     General: No focal deficit present.     Mental Status: She is alert.  Psychiatric:        Mood and Affect: Mood normal.        Behavior: Behavior normal.         Assessment And Plan:     1. Class 1 obesity due to excess calories without serious comorbidity with body mass index (BMI) of 34.0 to 34.9 in adult  She was congratulated on her 8 pound weight loss and to continue to incorporate necessary lifestyle changes to continue with her weight loss. Encouraged to strive for initial goal of BMI less than 30 to decrease cardiac risk.  2. Personal history of covid-19   [name], MD    THE PATIENT IS ENCOURAGED TO PRACTICE SOCIAL DISTANCING DUE TO THE COVID-19 PANDEMIC.

## 2019-11-03 ENCOUNTER — Encounter: Payer: Self-pay | Admitting: Internal Medicine

## 2019-11-05 ENCOUNTER — Encounter: Payer: Self-pay | Admitting: Internal Medicine

## 2019-11-05 ENCOUNTER — Ambulatory Visit (INDEPENDENT_AMBULATORY_CARE_PROVIDER_SITE_OTHER): Payer: No Typology Code available for payment source | Admitting: Internal Medicine

## 2019-11-05 ENCOUNTER — Other Ambulatory Visit: Payer: Self-pay

## 2019-11-05 VITALS — BP 112/70 | HR 74 | Temp 98.4°F | Ht 63.0 in | Wt 189.0 lb

## 2019-11-05 DIAGNOSIS — E162 Hypoglycemia, unspecified: Secondary | ICD-10-CM

## 2019-11-05 DIAGNOSIS — R42 Dizziness and giddiness: Secondary | ICD-10-CM

## 2019-11-05 DIAGNOSIS — E6609 Other obesity due to excess calories: Secondary | ICD-10-CM

## 2019-11-05 DIAGNOSIS — Z6833 Body mass index (BMI) 33.0-33.9, adult: Secondary | ICD-10-CM | POA: Diagnosis not present

## 2019-11-05 DIAGNOSIS — Z79899 Other long term (current) drug therapy: Secondary | ICD-10-CM | POA: Diagnosis not present

## 2019-11-05 MED ORDER — MECLIZINE HCL 25 MG PO TABS: 25.0000 mg | ORAL_TABLET | Freq: Three times a day (TID) | ORAL | 0 refills | Status: DC | PRN

## 2019-11-05 NOTE — Patient Instructions (Signed)

## 2019-11-06 LAB — CBC
Hematocrit: 42.1 % (ref 34.0–46.6)
Hemoglobin: 14.3 g/dL (ref 11.1–15.9)
MCH: 31.7 pg (ref 26.6–33.0)
MCHC: 34 g/dL (ref 31.5–35.7)
MCV: 93 fL (ref 79–97)
Platelets: 328 10*3/uL (ref 150–450)
RBC: 4.51 x10E6/uL (ref 3.77–5.28)
RDW: 12.5 % (ref 11.7–15.4)
WBC: 7.8 10*3/uL (ref 3.4–10.8)

## 2019-11-06 LAB — CMP14+EGFR
ALT: 15 IU/L (ref 0–32)
AST: 19 IU/L (ref 0–40)
Albumin/Globulin Ratio: 2 (ref 1.2–2.2)
Albumin: 4.4 g/dL (ref 3.8–4.9)
Alkaline Phosphatase: 74 IU/L (ref 48–121)
BUN/Creatinine Ratio: 17 (ref 9–23)
BUN: 12 mg/dL (ref 6–24)
Bilirubin Total: 0.3 mg/dL (ref 0.0–1.2)
CO2: 23 mmol/L (ref 20–29)
Calcium: 9.3 mg/dL (ref 8.7–10.2)
Chloride: 105 mmol/L (ref 96–106)
Creatinine, Ser: 0.72 mg/dL (ref 0.57–1.00)
GFR calc Af Amer: 108 mL/min/{1.73_m2} (ref >59)
GFR calc non Af Amer: 94 mL/min/{1.73_m2} (ref >59)
Globulin, Total: 2.2 g/dL (ref 1.5–4.5)
Glucose: 89 mg/dL (ref 65–99)
Potassium: 4.6 mmol/L (ref 3.5–5.2)
Sodium: 142 mmol/L (ref 134–144)
Total Protein: 6.6 g/dL (ref 6.0–8.5)

## 2019-11-06 LAB — VITAMIN B12: Vitamin B-12: 617 pg/mL (ref 232–1245)

## 2019-11-07 ENCOUNTER — Telehealth: Payer: Self-pay

## 2019-11-07 NOTE — Telephone Encounter (Signed)
Called to check on patient after last visit she said that she is feeling better. she said that she still gets dizzy but not as much. she said that she is extremely tired still

## 2019-11-16 NOTE — Progress Notes (Signed)
This visit occurred during the SARS-CoV-2 public health emergency.  Safety protocols were in place, including screening questions prior to the visit, additional usage of staff PPE, and extensive cleaning of exam room while observing appropriate contact time as indicated for disinfecting solutions.  Subjective:     Patient ID: Danielle Silva , female    DOB: Oct 13, 1960 , 59 y.o.   MRN: [id number]   Chief Complaint  Patient presents with  . Weight Check    last rybelsus taken last Thursday  . low blood sugars    HPI  She is here today for further evaluation of insulin resistance/obesity. She has been having episodes of dizziness. Had episode of hypoglycemia with BS 42. Has been taking Rybelsus while following keto diet. She took her last dose last Thursday, her sx are slowly improving. But states she feels like "a wet noodle".     Past Medical History:  Diagnosis Date  . Anxiety   . Depression   . Diverticulitis      Family History  Problem Relation Age of Onset  . Diabetes Mother   . Hypertension Mother   . Hyperlipidemia Mother   . Heart disease Mother        CABG  . Hypertension Father   . Kidney disease Father        kidney transplant  . Thyroid disease Father   . Heart disease Father        A-fib  . Arthritis Brother      Current Outpatient Medications:  .  Azelastine HCl 137 MCG/SPRAY SOLN, SPRAY 2 SPRAYS IN EACH NOSTRIL AS DIRECTED, Disp: 30 mL, Rfl: 1 .  buPROPion (WELLBUTRIN XL) 150 MG 24 hr tablet, TAKE 1 TABLET BY MOUTH EVERY DAY, Disp: 90 tablet, Rfl: 2 .  calcium carbonate (OS-CAL) 600 MG TABS tablet, Take 600 mg by mouth daily. , Disp: , Rfl:  .  cetirizine (ZYRTEC) 5 MG tablet, Take 5 mg by mouth daily., Disp: , Rfl:  .  Cholecalciferol (VITAMIN D3) 5000 units CAPS, Take by mouth., Disp: , Rfl:  .  Multiple Vitamin (MULTIVITAMIN WITH MINERALS) TABS tablet, Take 1 tablet by mouth daily., Disp: , Rfl:  .  Semaglutide (RYBELSUS) 3 MG TABS, Take 3 mg by  mouth daily., Disp: 30 tablet, Rfl: 0 .  meclizine (ANTIVERT) 25 MG tablet, Take 1 tablet (25 mg total) by mouth 3 (three) times daily as needed for dizziness., Disp: 30 tablet, Rfl: 0   Allergies  Allergen Reactions  . Macrobid [Nitrofurantoin Monohydrate Macrocrystals]   . Nsaids   . Percocet [Oxycodone-Acetaminophen]      Review of Systems  Constitutional: Negative.   Respiratory: Negative.   Cardiovascular: Negative.   Gastrointestinal: Negative.   Neurological: Positive for dizziness.  Psychiatric/Behavioral: Negative.      Today's Vitals   11/05/19 1058  BP: 112/70  Pulse: 74  Temp: 98.4 F (36.9 C)  TempSrc: Oral  Weight: 189 lb (85.7 kg)  Height: 5' 3"  (1.6 m)   Body mass index is 33.48 kg/m.   Objective:  Physical Exam Vitals and nursing note reviewed.  Constitutional:      Appearance: Normal appearance.  HENT:     Head: Normocephalic and atraumatic.     Right Ear: Tympanic membrane, ear canal and external ear normal.     Left Ear: Tympanic membrane, ear canal and external ear normal.  Cardiovascular:     Rate and Rhythm: Normal rate and regular rhythm.     Heart  sounds: Normal heart sounds.  Pulmonary:     Effort: Pulmonary effort is normal.     Breath sounds: Normal breath sounds.  Skin:    General: Skin is warm.  Neurological:     General: No focal deficit present.     Mental Status: She is alert.  Psychiatric:        Mood and Affect: Mood normal.        Behavior: Behavior normal.         Assessment And Plan:     1. Dizziness  Negative for orthostatics. She is encouraged to stay well hydrated.  I will check labs as listed below.   - CMP14+EGFR - CBC no Diff  2. Hypoglycemia  Resolved off of Rybelsus. Pt would likely feel better with more carbs in her diet while on GLP-1 agonist. However, she has been advised to stop this medication.   3. Class 1 obesity due to excess calories with serious comorbidity and body mass index (BMI) of 33.0  to 33.9 in adult  She is encouraged to strive for BMI less than 30 to decrease cardiac risk. She is advised to aim for at least 150 minutes of exercise per week.   4. Drug therapy  - Vitamin B12    [name], MD    THE PATIENT IS ENCOURAGED TO PRACTICE SOCIAL DISTANCING DUE TO THE COVID-19 PANDEMIC.

## 2019-12-09 ENCOUNTER — Ambulatory Visit: Payer: 59 | Admitting: Internal Medicine

## 2020-01-16 DIAGNOSIS — J0100 Acute maxillary sinusitis, unspecified: Secondary | ICD-10-CM | POA: Diagnosis not present

## 2020-01-20 ENCOUNTER — Other Ambulatory Visit: Payer: Self-pay | Admitting: Internal Medicine

## 2020-01-28 DIAGNOSIS — Z20822 Contact with and (suspected) exposure to covid-19: Secondary | ICD-10-CM | POA: Diagnosis not present

## 2020-02-15 ENCOUNTER — Other Ambulatory Visit: Payer: Self-pay | Admitting: Internal Medicine

## 2020-03-02 ENCOUNTER — Other Ambulatory Visit: Payer: Self-pay | Admitting: Internal Medicine

## 2020-03-29 DIAGNOSIS — J069 Acute upper respiratory infection, unspecified: Secondary | ICD-10-CM | POA: Diagnosis not present

## 2020-08-23 ENCOUNTER — Encounter: Payer: Self-pay | Admitting: Internal Medicine

## 2020-08-23 ENCOUNTER — Other Ambulatory Visit: Payer: Self-pay

## 2020-08-23 ENCOUNTER — Ambulatory Visit (INDEPENDENT_AMBULATORY_CARE_PROVIDER_SITE_OTHER): Payer: No Typology Code available for payment source | Admitting: Internal Medicine

## 2020-08-23 ENCOUNTER — Other Ambulatory Visit: Payer: Self-pay | Admitting: Internal Medicine

## 2020-08-23 VITALS — BP 124/86 | HR 76 | Temp 98.2°F | Ht 64.6 in | Wt 203.0 lb

## 2020-08-23 DIAGNOSIS — Z6834 Body mass index (BMI) 34.0-34.9, adult: Secondary | ICD-10-CM | POA: Diagnosis not present

## 2020-08-23 DIAGNOSIS — R635 Abnormal weight gain: Secondary | ICD-10-CM | POA: Diagnosis not present

## 2020-08-23 DIAGNOSIS — Z0000 Encounter for general adult medical examination without abnormal findings: Secondary | ICD-10-CM | POA: Diagnosis not present

## 2020-08-23 DIAGNOSIS — E6609 Other obesity due to excess calories: Secondary | ICD-10-CM

## 2020-08-23 MED ORDER — BUPROPION HCL ER (XL) 150 MG PO TB24: 1.0000 | ORAL_TABLET | Freq: Every day | ORAL | 2 refills | Status: DC

## 2020-08-23 MED ORDER — AZELASTINE HCL 0.1 % NA SOLN: NASAL | 5 refills | Status: DC

## 2020-08-23 NOTE — Patient Instructions (Signed)
Health Maintenance, Female Adopting a healthy lifestyle and getting preventive care are important in promoting health and wellness. Ask your health care provider about:  The right schedule for you to have regular tests and exams.  Things you can do on your own to prevent diseases and keep yourself healthy. What should I know about diet, weight, and exercise? Eat a healthy diet  Eat a diet that includes plenty of vegetables, fruits, low-fat dairy products, and lean protein.  Do not eat a lot of foods that are high in solid fats, added sugars, or sodium.   Maintain a healthy weight Body mass index (BMI) is used to identify weight problems. It estimates body fat based on height and weight. Your health care provider can help determine your BMI and help you achieve or maintain a healthy weight. Get regular exercise Get regular exercise. This is one of the most important things you can do for your health. Most adults should:  Exercise for at least 150 minutes each week. The exercise should increase your heart rate and make you sweat (moderate-intensity exercise).  Do strengthening exercises at least twice a week. This is in addition to the moderate-intensity exercise.  Spend less time sitting. Even light physical activity can be beneficial. Watch cholesterol and blood lipids Have your blood tested for lipids and cholesterol at 58 years of age, then have this test every 5 years. Have your cholesterol levels checked more often if:  Your lipid or cholesterol levels are high.  You are older than 58 years of age.  You are at high risk for heart disease. What should I know about cancer screening? Depending on your health history and family history, you may need to have cancer screening at various ages. This may include screening for:  Breast cancer.  Cervical cancer.  Colorectal cancer.  Skin cancer.  Lung cancer. What should I know about heart disease, diabetes, and high blood  pressure? Blood pressure and heart disease  High blood pressure causes heart disease and increases the risk of stroke. This is more likely to develop in people who have high blood pressure readings, are of African descent, or are overweight.  Have your blood pressure checked: ? Every 3-5 years if you are 18-39 years of age. ? Every year if you are 40 years old or older. Diabetes Have regular diabetes screenings. This checks your fasting blood sugar level. Have the screening done:  Once every three years after age 40 if you are at a normal weight and have a low risk for diabetes.  More often and at a younger age if you are overweight or have a high risk for diabetes. What should I know about preventing infection? Hepatitis B If you have a higher risk for hepatitis B, you should be screened for this virus. Talk with your health care provider to find out if you are at risk for hepatitis B infection. Hepatitis C Testing is recommended for:  Everyone born from 1945 through 1965.  Anyone with known risk factors for hepatitis C. Sexually transmitted infections (STIs)  Get screened for STIs, including gonorrhea and chlamydia, if: ? You are sexually active and are younger than 58 years of age. ? You are older than 58 years of age and your health care provider tells you that you are at risk for this type of infection. ? Your sexual activity has changed since you were last screened, and you are at increased risk for chlamydia or gonorrhea. Ask your health care provider   if you are at risk.  Ask your health care provider about whether you are at high risk for HIV. Your health care provider may recommend a prescription medicine to help prevent HIV infection. If you choose to take medicine to prevent HIV, you should first get tested for HIV. You should then be tested every 3 months for as long as you are taking the medicine. Pregnancy  If you are about to stop having your period (premenopausal) and  you may become pregnant, seek counseling before you get pregnant.  Take 400 to 800 micrograms (mcg) of folic acid every day if you become pregnant.  Ask for birth control (contraception) if you want to prevent pregnancy. Osteoporosis and menopause Osteoporosis is a disease in which the bones lose minerals and strength with aging. This can result in bone fractures. If you are 65 years old or older, or if you are at risk for osteoporosis and fractures, ask your health care provider if you should:  Be screened for bone loss.  Take a calcium or vitamin D supplement to lower your risk of fractures.  Be given hormone replacement therapy (HRT) to treat symptoms of menopause. Follow these instructions at home: Lifestyle  Do not use any products that contain nicotine or tobacco, such as cigarettes, e-cigarettes, and chewing tobacco. If you need help quitting, ask your health care provider.  Do not use street drugs.  Do not share needles.  Ask your health care provider for help if you need support or information about quitting drugs. Alcohol use  Do not drink alcohol if: ? Your health care provider tells you not to drink. ? You are pregnant, may be pregnant, or are planning to become pregnant.  If you drink alcohol: ? Limit how much you use to 0-1 drink a day. ? Limit intake if you are breastfeeding.  Be aware of how much alcohol is in your drink. In the [place] one drink equals one 12 oz bottle of beer (355 mL), one 5 oz glass of wine (148 mL), or one 1 oz glass of hard liquor (44 mL). General instructions  Schedule regular health, dental, and eye exams.  Stay current with your vaccines.  Tell your health care provider if: ? You often feel depressed. ? You have ever been abused or do not feel safe at home. Summary  Adopting a healthy lifestyle and getting preventive care are important in promoting health and wellness.  Follow your health care provider's instructions about healthy  diet, exercising, and getting tested or screened for diseases.  Follow your health care provider's instructions on monitoring your cholesterol and blood pressure. This information is not intended to replace advice given to you by your health care provider. Make sure you discuss any questions you have with your health care provider. Document Revised: 05/01/2018 Document Reviewed: 05/01/2018 Elsevier Patient Education  2021 [employer]  

## 2020-08-23 NOTE — Progress Notes (Signed)
I,Katawbba Wiggins,acting as a Education administrator for [name], MD.,have documented all relevant documentation on the behalf of [name], MD,as directed by  [name], MD while in the presence of [name], MD.  This visit occurred during the SARS-CoV-2 public health emergency.  Safety protocols were in place, including screening questions prior to the visit, additional usage of staff PPE, and extensive cleaning of exam room while observing appropriate contact time as indicated for disinfecting solutions.  Subjective:     Patient ID: Danielle Silva , female    DOB: [birth year] , 60 y.o.   MRN: [id number]   Chief Complaint  Patient presents with  . Annual Exam    HPI  She is here today for a full physical examination. She is followed by Dr. Garwin Brothers for her GYN exams. She has yet to re-establish care at her new location. Last exam was 08/19/2019.  She has no specific concerns or complaints at this time.     Past Medical History:  Diagnosis Date  . Anxiety   . Depression   . Diverticulitis      Family History  Problem Relation Age of Onset  . Diabetes Mother   . Hypertension Mother   . Hyperlipidemia Mother   . Heart disease Mother        CABG  . Hypertension Father   . Kidney disease Father        kidney transplant  . Thyroid disease Father   . Heart disease Father        A-fib  . Arthritis Brother      Current Outpatient Medications:  .  calcium carbonate (OS-CAL) 600 MG TABS tablet, Take 600 mg by mouth daily. , Disp: , Rfl:  .  cetirizine (ZYRTEC) 5 MG tablet, Take 5 mg by mouth daily., Disp: , Rfl:  .  Cholecalciferol (VITAMIN D3) 5000 units CAPS, Take by mouth., Disp: , Rfl:  .  Multiple Vitamin (MULTIVITAMIN WITH MINERALS) TABS tablet, Take 1 tablet by mouth daily., Disp: , Rfl:  .  azelastine (ASTELIN) 0.1 % nasal spray, SPRAY 2 SPRAYS IN EACH NOSTRIL AS DIRECTED, Disp: 30 mL, Rfl: 5 .  buPROPion (WELLBUTRIN XL) 150 MG 24 hr tablet, Take 1 tablet  (150 mg total) by mouth daily., Disp: 90 tablet, Rfl: 2 .  meclizine (ANTIVERT) 25 MG tablet, Take 1 tablet (25 mg total) by mouth 3 (three) times daily as needed for dizziness. (Patient not taking: Reported on 08/23/2020), Disp: 30 tablet, Rfl: 0   Allergies  Allergen Reactions  . Macrobid [Nitrofurantoin Monohydrate Macrocrystals]   . Nsaids   . Percocet [Oxycodone-Acetaminophen]       The patient states she uses tubal ligation for birth control. Last LMP was Patient's last menstrual period was 01/05/2012.. Negative for Dysmenorrhea. Negative for: breast discharge, breast lump(s), breast pain and breast self exam. Associated symptoms include abnormal vaginal bleeding. Pertinent negatives include abnormal bleeding (hematology), anxiety, decreased libido, depression, difficulty falling sleep, dyspareunia, history of infertility, nocturia, sexual dysfunction, sleep disturbances, urinary incontinence, urinary urgency, vaginal discharge and vaginal itching. Diet regular.The patient states her exercise level is  intermittent.  . The patient's tobacco use is:  Social History   Tobacco Use  Smoking Status Former Smoker  . Years: 15.00  Smokeless Tobacco Never Used  . She has been exposed to passive smoke. The patient's alcohol use is:  Social History   Substance and Sexual Activity  Alcohol Use Yes   Comment: occ  Review of Systems  Constitutional: Negative.   HENT: Negative.   Eyes: Negative.   Respiratory: Negative.   Cardiovascular: Negative.   Gastrointestinal: Negative.   Endocrine: Negative.   Genitourinary: Negative.   Musculoskeletal: Negative.   Skin: Negative.   Allergic/Immunologic: Negative.   Neurological: Negative.   Hematological: Negative.   Psychiatric/Behavioral: Negative.      Today's Vitals   08/23/20 0855  BP: 124/86  Pulse: 76  Temp: 98.2 F (36.8 C)  TempSrc: Oral  Weight: 203 lb (92.1 kg)  Height: 5' 4.6" (1.641 m)   Body mass index is 34.2  kg/m.  Wt Readings from Last 3 Encounters:  08/23/20 203 lb (92.1 kg)  11/05/19 189 lb (85.7 kg)  09/30/19 192 lb 6.4 oz (87.3 kg)   Objective:  Physical Exam Vitals and nursing note reviewed.  Constitutional:      Appearance: Normal appearance. She is obese.  HENT:     Head: Normocephalic and atraumatic.     Right Ear: Tympanic membrane, ear canal and external ear normal.     Left Ear: Tympanic membrane, ear canal and external ear normal.     Nose:     Comments: Masked     Mouth/Throat:     Comments: Masked  Eyes:     Extraocular Movements: Extraocular movements intact.     Conjunctiva/sclera: Conjunctivae normal.     Pupils: Pupils are equal, round, and reactive to light.  Cardiovascular:     Rate and Rhythm: Normal rate and regular rhythm.     Pulses: Normal pulses.     Heart sounds: Normal heart sounds.  Pulmonary:     Effort: Pulmonary effort is normal.     Breath sounds: Normal breath sounds.  Abdominal:     General: Bowel sounds are normal.     Palpations: Abdomen is soft.     Comments: Healed surgical scar, RUQ Soft, obese  Genitourinary:    Comments: deferred Musculoskeletal:        General: Normal range of motion.     Cervical back: Normal range of motion and neck supple.  Skin:    General: Skin is warm and dry.     Comments: Scattered tattoos b/l UE, torso  Neurological:     General: No focal deficit present.     Mental Status: She is alert and oriented to person, place, and time.  Psychiatric:        Mood and Affect: Mood normal.        Behavior: Behavior normal.         Assessment And Plan:     1. Routine general medical examination at health care facility Comments: A full exam was performed.  Importance of monthly self breast exams was discussed with the patient.ij  She is not vaccinated against COVID. She will consider use of Novavax when available.  PATIENT IS ADVISED TO GET 30-45 MINUTES REGULAR EXERCISE NO LESS THAN FOUR TO FIVE DAYS PER WEEK  - BOTH WEIGHTBEARING EXERCISES AND AEROBIC ARE RECOMMENDED.  PATIENT IS ADVISED TO FOLLOW A HEALTHY DIET WITH AT LEAST SIX FRUITS/VEGGIES PER DAY, DECREASE INTAKE OF RED MEAT, AND TO INCREASE FISH INTAKE TO TWO DAYS PER WEEK.  MEATS/FISH SHOULD NOT BE FRIED, BAKED OR BROILED IS PREFERABLE.  IT IS ALSO IMPORTANT TO CUT BACK ON YOUR SUGAR INTAKE. PLEASE AVOID ANYTHING WITH ADDED SUGAR, CORN SYRUP OR OTHER SWEETENERS. IF YOU MUST USE A SWEETENER, YOU CAN TRY STEVIA. IT IS ALSO IMPORTANT TO AVOID ARTIFICIALLY SWEETENERS  AND DIET BEVERAGES. LASTLY, I SUGGEST WEARING SPF 50 SUNSCREEN ON EXPOSED PARTS AND ESPECIALLY WHEN IN THE DIRECT SUNLIGHT FOR AN EXTENDED PERIOD OF TIME.  PLEASE AVOID FAST FOOD RESTAURANTS AND INCREASE YOUR WATER INTAKE.  - CBC - CMP14+EGFR - Lipid panel - Insulin, random(561) - TSH  2. Weight gain Comments: I will check labs as listed below. Encouraged to resume her diet free of refined carbs. - Hemoglobin A1c - Insulin, random(561) - TSH  3. Class 1 obesity due to excess calories with serious comorbidity and body mass index (BMI) of 34.0 to 34.9 in adult Comments: She was made aware of 14 pound weight gain. She is advised to incorporate more exercise into her daily routine. She is encouraged to strive for BMI less than 30 to decrease cardiac risk.   Patient was given opportunity to ask questions. Patient verbalized understanding of the plan and was able to repeat key elements of the plan. All questions were answered to their satisfaction.   I, [name], MD, have reviewed all documentation for this visit. The documentation on 08/23/20 for the exam, diagnosis, procedures, and orders are all accurate and complete.  THE PATIENT IS ENCOURAGED TO PRACTICE SOCIAL DISTANCING DUE TO THE COVID-19 PANDEMIC.

## 2020-08-24 LAB — CMP14+EGFR
ALT: 15 IU/L (ref 0–32)
AST: 20 IU/L (ref 0–40)
Albumin/Globulin Ratio: 1.5 (ref 1.2–2.2)
Albumin: 4.3 g/dL (ref 3.8–4.9)
Alkaline Phosphatase: 82 IU/L (ref 44–121)
BUN/Creatinine Ratio: 14 (ref 9–23)
BUN: 11 mg/dL (ref 6–24)
Bilirubin Total: 0.8 mg/dL (ref 0.0–1.2)
CO2: 23 mmol/L (ref 20–29)
Calcium: 10.2 mg/dL (ref 8.7–10.2)
Chloride: 102 mmol/L (ref 96–106)
Creatinine, Ser: 0.8 mg/dL (ref 0.57–1.00)
Globulin, Total: 2.9 g/dL (ref 1.5–4.5)
Glucose: 87 mg/dL (ref 65–99)
Potassium: 4.4 mmol/L (ref 3.5–5.2)
Sodium: 139 mmol/L (ref 134–144)
Total Protein: 7.2 g/dL (ref 6.0–8.5)
eGFR: 86 mL/min/{1.73_m2} (ref >59)

## 2020-08-24 LAB — CBC
Hematocrit: 41.8 % (ref 34.0–46.6)
Hemoglobin: 14.7 g/dL (ref 11.1–15.9)
MCH: 31.5 pg (ref 26.6–33.0)
MCHC: 35.2 g/dL (ref 31.5–35.7)
MCV: 90 fL (ref 79–97)
Platelets: 342 10*3/uL (ref 150–450)
RBC: 4.67 x10E6/uL (ref 3.77–5.28)
RDW: 11.9 % (ref 11.7–15.4)
WBC: 6 10*3/uL (ref 3.4–10.8)

## 2020-08-24 LAB — HEMOGLOBIN A1C
Est. average glucose Bld gHb Est-mCnc: 108 mg/dL
Hgb A1c MFr Bld: 5.4 % (ref 4.8–5.6)

## 2020-08-24 LAB — INSULIN, RANDOM: INSULIN: 11.7 u[IU]/mL (ref 2.6–24.9)

## 2020-08-24 LAB — LIPID PANEL
Chol/HDL Ratio: 3.6 ratio (ref 0.0–4.4)
Cholesterol, Total: 228 mg/dL — ABNORMAL HIGH (ref 100–199)
HDL: 63 mg/dL (ref >39)
LDL Chol Calc (NIH): 148 mg/dL — ABNORMAL HIGH (ref 0–99)
Triglycerides: 98 mg/dL (ref 0–149)
VLDL Cholesterol Cal: 17 mg/dL (ref 5–40)

## 2020-08-24 LAB — TSH: TSH: 1.54 u[IU]/mL (ref 0.450–4.500)

## 2020-08-26 ENCOUNTER — Encounter: Payer: Self-pay | Admitting: Internal Medicine

## 2020-08-27 ENCOUNTER — Other Ambulatory Visit: Payer: Self-pay

## 2020-08-27 ENCOUNTER — Telehealth: Payer: Self-pay

## 2020-08-27 MED ORDER — RYBELSUS 3 MG PO TABS: 3.0000 mg | ORAL_TABLET | Freq: Every day | ORAL | 1 refills | Status: DC

## 2020-08-27 NOTE — Telephone Encounter (Signed)
Prior auth done for rybelsus 3 mg waiting on response form the pt's insurance.

## 2020-09-06 DIAGNOSIS — Z01419 Encounter for gynecological examination (general) (routine) without abnormal findings: Secondary | ICD-10-CM | POA: Diagnosis not present

## 2020-09-06 DIAGNOSIS — Z1231 Encounter for screening mammogram for malignant neoplasm of breast: Secondary | ICD-10-CM | POA: Diagnosis not present

## 2020-09-06 DIAGNOSIS — Z780 Asymptomatic menopausal state: Secondary | ICD-10-CM | POA: Diagnosis not present

## 2020-09-07 ENCOUNTER — Encounter: Payer: Self-pay | Admitting: Internal Medicine

## 2020-09-07 ENCOUNTER — Other Ambulatory Visit: Payer: Self-pay

## 2020-09-07 ENCOUNTER — Ambulatory Visit (INDEPENDENT_AMBULATORY_CARE_PROVIDER_SITE_OTHER): Payer: No Typology Code available for payment source | Admitting: Nurse Practitioner

## 2020-09-07 VITALS — BP 138/92 | HR 90 | Temp 98.5°F | Ht 64.6 in | Wt 202.6 lb

## 2020-09-07 DIAGNOSIS — R030 Elevated blood-pressure reading, without diagnosis of hypertension: Secondary | ICD-10-CM

## 2020-09-07 MED ORDER — VALSARTAN-HYDROCHLOROTHIAZIDE 80-12.5 MG PO TABS: 1.0000 | ORAL_TABLET | Freq: Every day | ORAL | 3 refills | Status: DC

## 2020-09-07 NOTE — Patient Instructions (Signed)

## 2020-09-07 NOTE — Progress Notes (Signed)
I,Tianna Badgett,acting as a Education administrator for [employer], NP.,have documented all relevant documentation on the behalf of [employer], NP,as directed by  Bary Castilla, NP while in the presence of Bary Castilla, NP.  This visit occurred during the SARS-CoV-2 public health emergency.  Safety protocols were in place, including screening questions prior to the visit, additional usage of staff PPE, and extensive cleaning of exam room while observing appropriate contact time as indicated for disinfecting solutions.  Subjective:     Patient ID: Danielle Silva , female    DOB: 10-11-1960 , 60 y.o.   MRN: [id number]   Chief Complaint  Patient presents with  . Hypertension    HPI  Patient is here for elevated blood pressure. She states that she has been having high blood pressure readings. Yesterday she was seen by her GYN and her blood pressure was 143/105. Last night before bed she checked it 173/110 and she took an expired valsartan/ hctz 160-12.5. this morning her reading was 148/103. She does complaint of headaches. Patient denies chest pain and SOB.   BP Readings from Last 3 Encounters: 09/07/20 : (!) 138/92 08/23/20 : 124/86 11/05/19 : 112/70     Past Medical History:  Diagnosis Date  . Anxiety   . Depression   . Diverticulitis      Family History  Problem Relation Age of Onset  . Diabetes Mother   . Hypertension Mother   . Hyperlipidemia Mother   . Heart disease Mother        CABG  . Hypertension Father   . Kidney disease Father        kidney transplant  . Thyroid disease Father   . Heart disease Father        A-fib  . Arthritis Brother      Current Outpatient Medications:  .  valsartan-hydrochlorothiazide (DIOVAN HCT) 80-12.5 MG tablet, Take 1 tablet by mouth daily., Disp: 90 tablet, Rfl: 3 .  azelastine (ASTELIN) 0.1 % nasal spray, SPRAY 2 SPRAYS IN EACH NOSTRIL AS DIRECTED, Disp: 30 mL, Rfl: 5 .  buPROPion (WELLBUTRIN XL) 150 MG 24 hr tablet, Take  1 tablet (150 mg total) by mouth daily., Disp: 90 tablet, Rfl: 2 .  calcium carbonate (OS-CAL) 600 MG TABS tablet, Take 600 mg by mouth daily. , Disp: , Rfl:  .  cetirizine (ZYRTEC) 5 MG tablet, Take 5 mg by mouth daily., Disp: , Rfl:  .  Cholecalciferol (VITAMIN D3) 5000 units CAPS, Take by mouth., Disp: , Rfl:  .  meclizine (ANTIVERT) 25 MG tablet, Take 1 tablet (25 mg total) by mouth 3 (three) times daily as needed for dizziness. (Patient not taking: Reported on 08/23/2020), Disp: 30 tablet, Rfl: 0 .  Multiple Vitamin (MULTIVITAMIN WITH MINERALS) TABS tablet, Take 1 tablet by mouth daily., Disp: , Rfl:  .  Semaglutide (RYBELSUS) 3 MG TABS, Take 3 mg by mouth daily., Disp: 30 tablet, Rfl: 1   Allergies  Allergen Reactions  . Macrobid [Nitrofurantoin Monohydrate Macrocrystals]   . Nsaids   . Percocet [Oxycodone-Acetaminophen]      Review of Systems  Constitutional: Negative.  Negative for chills and fever.  Respiratory: Negative.  Negative for cough, shortness of breath and wheezing.   Cardiovascular: Negative.  Negative for chest pain and palpitations.  Gastrointestinal: Negative.   Musculoskeletal: Negative for arthralgias and myalgias.  Neurological: Positive for headaches. Negative for tremors and numbness.     Today's Vitals   09/07/20 1633  BP: (!) 138/92  Pulse:  90  Temp: 98.5 F (36.9 C)  TempSrc: Oral  Weight: 202 lb 9.6 oz (91.9 kg)  Height: 5' 4.6" (1.641 m)   Body mass index is 34.13 kg/m.   Objective:  Physical Exam Constitutional:      Appearance: Normal appearance. She is obese.  HENT:     Head: Normocephalic and atraumatic.  Cardiovascular:     Rate and Rhythm: Normal rate and regular rhythm.     Pulses: Normal pulses.     Heart sounds: Normal heart sounds. No murmur heard.   Pulmonary:     Effort: Pulmonary effort is normal. No respiratory distress.     Breath sounds: Normal breath sounds. No wheezing.  Skin:    General: Skin is warm and dry.   Neurological:     Mental Status: She is alert.         Assessment And Plan:     1. Elevated blood pressure reading -She has had a elevated BP in the past for which is was being treated for. Last time she was on medication was in 2018. We will start her on Valsartan-hydrochlorothiazide 80-12.5 once daily and she will keep a log of her BP at home. Patient verbalized understanding and will let us know if her BP reading is still high at home so we can reevaluate and increase her dose if needed.  -Advised patient of a low sodium diet, eliminate processed food and canned foods.  -Exercise for atleast 30-45 min. Daily.  - valsartan-hydrochlorothiazide (DIOVAN HCT) 80-12.5 MG tablet; Take 1 tablet by mouth daily.  Dispense: 90 tablet; Refill: 3   Follow up 2 months   Patient was given opportunity to ask questions. Patient verbalized understanding of the plan and was able to repeat key elements of the plan. All questions were answered to their satisfaction.  Bary Castilla, DNP   I, Bary Castilla, DNP  have reviewed all documentation for this visit. The documentation on 09/07/20  for the exam, diagnosis, procedures, and orders are all accurate and complete.   IF YOU HAVE BEEN REFERRED TO A SPECIALIST, IT MAY TAKE 1-2 WEEKS TO SCHEDULE/PROCESS THE REFERRAL. IF YOU HAVE NOT HEARD FROM US/SPECIALIST IN TWO WEEKS, PLEASE GIVE [place] A CALL AT [phone].   THE PATIENT IS ENCOURAGED TO PRACTICE SOCIAL DISTANCING DUE TO THE COVID-19 PANDEMIC.

## 2020-09-10 DIAGNOSIS — M19011 Primary osteoarthritis, right shoulder: Secondary | ICD-10-CM | POA: Diagnosis not present

## 2020-09-14 ENCOUNTER — Encounter: Payer: Self-pay | Admitting: Internal Medicine

## 2020-09-28 ENCOUNTER — Encounter: Payer: Self-pay | Admitting: Internal Medicine

## 2020-09-29 DIAGNOSIS — M67911 Unspecified disorder of synovium and tendon, right shoulder: Secondary | ICD-10-CM | POA: Diagnosis not present

## 2020-10-14 DIAGNOSIS — M25511 Pain in right shoulder: Secondary | ICD-10-CM | POA: Diagnosis not present

## 2020-10-19 ENCOUNTER — Other Ambulatory Visit: Payer: Self-pay | Admitting: Internal Medicine

## 2020-10-19 ENCOUNTER — Encounter: Payer: Self-pay | Admitting: Internal Medicine

## 2020-10-19 MED ORDER — TRAMADOL HCL 50 MG PO TABS: 50.0000 mg | ORAL_TABLET | Freq: Four times a day (QID) | ORAL | 0 refills | Status: DC | PRN

## 2020-10-22 ENCOUNTER — Encounter: Payer: Self-pay | Admitting: Internal Medicine

## 2020-10-22 DIAGNOSIS — M25511 Pain in right shoulder: Secondary | ICD-10-CM | POA: Diagnosis not present

## 2020-10-24 ENCOUNTER — Other Ambulatory Visit: Payer: Self-pay | Admitting: Internal Medicine

## 2020-10-24 ENCOUNTER — Encounter: Payer: Self-pay | Admitting: Internal Medicine

## 2020-10-24 DIAGNOSIS — M25559 Pain in unspecified hip: Secondary | ICD-10-CM

## 2020-10-27 ENCOUNTER — Encounter: Payer: Self-pay | Admitting: Internal Medicine

## 2020-10-27 ENCOUNTER — Telehealth (INDEPENDENT_AMBULATORY_CARE_PROVIDER_SITE_OTHER): Payer: 59 | Admitting: Internal Medicine

## 2020-10-27 VITALS — BP 113/81 | HR 78 | Ht 64.6 in

## 2020-10-27 DIAGNOSIS — M25552 Pain in left hip: Secondary | ICD-10-CM

## 2020-10-27 DIAGNOSIS — G8929 Other chronic pain: Secondary | ICD-10-CM | POA: Diagnosis not present

## 2020-10-27 DIAGNOSIS — M545 Low back pain, unspecified: Secondary | ICD-10-CM

## 2020-10-27 DIAGNOSIS — M25551 Pain in right hip: Secondary | ICD-10-CM | POA: Diagnosis not present

## 2020-10-27 DIAGNOSIS — Z780 Asymptomatic menopausal state: Secondary | ICD-10-CM | POA: Diagnosis not present

## 2020-10-27 LAB — HM DEXA SCAN

## 2020-10-27 NOTE — Progress Notes (Signed)
Virtual Visit via Video   This visit type was conducted due to national recommendations for restrictions regarding the COVID-19 Pandemic (e.g. social distancing) in an effort to limit this patient's exposure and mitigate transmission in our community.  Due to her co-morbid illnesses, this patient is at least at moderate risk for complications without adequate follow up.  This format is felt to be most appropriate for this patient at this time.  All issues noted in this document were discussed and addressed.  A limited physical exam was performed with this format.    This visit type was conducted due to national recommendations for restrictions regarding the COVID-19 Pandemic (e.g. social distancing) in an effort to limit this patient's exposure and mitigate transmission in our community.  Patients identity confirmed using two different identifiers.  This format is felt to be most appropriate for this patient at this time.  All issues noted in this document were discussed and addressed.  No physical exam was performed (except for noted visual exam findings with Video Visits).    Date:  11/05/2020   ID:  Danielle Silva, DOB 22-Apr-1961, MRN [id number]  Patient Location:  Home  Provider location:   Office    Chief Complaint:  "I have hip pain"  History of Present Illness:    Danielle Silva NEEDS is a 60 y.o. female who presents via video conferencing for a telehealth visit today.    The patient does not have symptoms concerning for COVID-19 infection (fever, chills, cough, or new shortness of breath).   She presents today for virtual visit. She prefers this method of contact due to COVID-19 pandemic. She presents for further evaluation of hip pain. Denies h/o fall /trauma.  States this is a chronic issue, but her sx seem to be worsening. She has been a runner, so feels she always had tight hips, R>L. She has been riding motorcycles recently, thinks this is exacerbating her sx. For the past month,  she has been having left hip pain associated with dull, throbbing LBP. Denies LLE/RLE weakness and paresthesias.  Hip Pain  The incident occurred more than 1 week ago. There was no injury mechanism. The pain is present in the left hip and right hip. The quality of the pain is described as aching. The pain is at a severity of 6/10. The pain is moderate. Pertinent negatives include no loss of sensation or numbness. She reports no foreign bodies present.    Past Medical History:  Diagnosis Date   Anxiety    Depression    Diverticulitis    Past Surgical History:  Procedure Laterality Date   ANKLE FRACTURE SURGERY Left 2011   Left ankle   CESAREAN SECTION  1985   CHOLECYSTECTOMY  1985   TONSILECTOMY/ADENOIDECTOMY WITH MYRINGOTOMY Bilateral 1980   TUBAL LIGATION  1985     Current Meds  Medication Sig   azelastine (ASTELIN) 0.1 % nasal spray SPRAY 2 SPRAYS IN EACH NOSTRIL AS DIRECTED   buPROPion (WELLBUTRIN XL) 150 MG 24 hr tablet Take 1 tablet (150 mg total) by mouth daily.   calcium carbonate (OS-CAL) 600 MG TABS tablet Take 600 mg by mouth daily.    cetirizine (ZYRTEC) 5 MG tablet Take 5 mg by mouth daily.   Cholecalciferol (VITAMIN D3) 5000 units CAPS Take by mouth.   Multiple Vitamin (MULTIVITAMIN WITH MINERALS) TABS tablet Take 1 tablet by mouth daily.   Semaglutide (RYBELSUS) 3 MG TABS Take 3 mg by mouth daily.   traMADol Veatrice Bourbon)  50 MG tablet Take 1 tablet (50 mg total) by mouth every 6 (six) hours as needed.   valsartan-hydrochlorothiazide (DIOVAN HCT) 80-12.5 MG tablet Take 1 tablet by mouth daily.     Allergies:   Macrobid [nitrofurantoin monohydrate macrocrystals], Nsaids, and Percocet [oxycodone-acetaminophen]   Social History   Tobacco Use   Smoking status: Former    Years: 15.00    Pack years: 0.00    Types: Cigarettes   Smokeless tobacco: Never  Vaping Use   Vaping Use: Never used  Substance Use Topics   Alcohol use: Yes    Comment: occ   Drug use: No      Family Hx: The patient's family history includes Arthritis in her brother; Diabetes in her mother; Heart disease in her father and mother; Hyperlipidemia in her mother; Hypertension in her father and mother; Kidney disease in her father; Thyroid disease in her father.  ROS:   Please see the history of present illness.    Review of Systems  Constitutional: Negative.   Respiratory: Negative.    Cardiovascular: Negative.   Gastrointestinal: Negative.   Musculoskeletal:  Positive for joint pain.  Neurological: Negative.  Negative for numbness.  Psychiatric/Behavioral: Negative.     All other systems reviewed and are negative.   Labs/Other Tests and Data Reviewed:    Recent Labs: 08/23/2020: ALT 15; BUN 11; Creatinine, Ser 0.80; Hemoglobin 14.7; Platelets 342; Potassium 4.4; Sodium 139; TSH 1.540   Recent Lipid Panel Lab Results  Component Value Date/Time   CHOL 228 (H) 08/23/2020 09:38 AM   TRIG 98 08/23/2020 09:38 AM   HDL 63 08/23/2020 09:38 AM   CHOLHDL 3.6 08/23/2020 09:38 AM   LDLCALC 148 (H) 08/23/2020 09:38 AM    Wt Readings from Last 3 Encounters:  09/07/20 202 lb 9.6 oz (91.9 kg)  08/23/20 203 lb (92.1 kg)  11/05/19 189 lb (85.7 kg)    BP Readings from Last 3 Encounters:  10/27/20 113/81  09/07/20 (!) 138/92  08/23/20 124/86    Exam:    Vital Signs:  BP 113/81 Comment: pt provided  Pulse 78 Comment: pt provided  Ht 5' 4.6" (1.641 m)   LMP 01/05/2012   BMI 34.13 kg/m     Physical Exam Vitals and nursing note reviewed.  Constitutional:      Appearance: Normal appearance.  HENT:     Head: Normocephalic and atraumatic.  Pulmonary:     Effort: Pulmonary effort is normal.  Musculoskeletal:     Cervical back: Normal range of motion.  Neurological:     Mental Status: She is alert and oriented to person, place, and time.  Psychiatric:        Mood and Affect: Affect normal.    ASSESSMENT & PLAN:     1. Bilateral hip pain Comments: Chronic, I wil  check b/l hip x-rays.   2. Chronic bilateral low back pain without sciatica Comments: I will check lumbar spine x-ray. I will make further recommendations once results are available for review.    COVID-19 Education: The signs and symptoms of COVID-19 were discussed with the patient and how to seek care for testing (follow up with PCP or arrange E-visit).  The importance of social distancing was discussed today.  Patient Risk:   After full review of this patients clinical status, I feel that they are at least moderate risk at this time.  Time:   Today, I have spent 11 minutes with the patient with telehealth technology discussing above diagnoses.  Medication Adjustments/Labs and Tests Ordered: Current medicines are reviewed at length with the patient today.  Concerns regarding medicines are outlined above.   Tests Ordered: No orders of the defined types were placed in this encounter.   Medication Changes: No orders of the defined types were placed in this encounter.   Disposition:  Follow up prn  Signed, [name], MD

## 2020-10-27 NOTE — Patient Instructions (Signed)
Hip Pain The hip is the joint between the upper legs and the lower pelvis. The bones, cartilage, tendons, and muscles of your hip joint support your body and allow you to move around. Hip pain can range from a minor ache to severe pain in one or both of your hips. The pain may be felt on the inside of the hip joint near the groin, or on the outside near the buttocks and upper thigh. You may also have swelling or stiffness in your hip area. Follow these instructions at home: Managing pain, stiffness, and swelling  If directed, put ice on the painful area. To do this: ? Put ice in a plastic bag. ? Place a towel between your skin and the bag. ? Leave the ice on for 20 minutes, 2-3 times a day.  If directed, apply heat to the affected area as often as told by your health care provider. Use the heat source that your health care provider recommends, such as a moist heat pack or a heating pad. ? Place a towel between your skin and the heat source. ? Leave the heat on for 20-30 minutes. ? Remove the heat if your skin turns bright red. This is especially important if you are unable to feel pain, heat, or cold. You may have a greater risk of getting burned.      Activity  Do exercises as told by your health care provider.  Avoid activities that cause pain. General instructions  Take over-the-counter and prescription medicines only as told by your health care provider.  Keep a journal of your symptoms. Write down: ? How often you have hip pain. ? The location of your pain. ? What the pain feels like. ? What makes the pain worse.  Sleep with a pillow between your legs on your most comfortable side.  Keep all follow-up visits as told by your health care provider. This is important.   Contact a health care provider if:  You cannot put weight on your leg.  Your pain or swelling continues or gets worse after one week.  It gets harder to walk.  You have a fever. Get help right away  if:  You fall.  You have a sudden increase in pain and swelling in your hip.  Your hip is red or swollen or very tender to touch. Summary  Hip pain can range from a minor ache to severe pain in one or both of your hips.  The pain may be felt on the inside of the hip joint near the groin, or on the outside near the buttocks and upper thigh.  Avoid activities that cause pain.  Write down how often you have hip pain, the location of the pain, what makes it worse, and what it feels like. This information is not intended to replace advice given to you by your health care provider. Make sure you discuss any questions you have with your health care provider. Document Revised: 09/23/2018 Document Reviewed: 09/23/2018 Elsevier Patient Education  2021 [employer]  

## 2020-10-28 ENCOUNTER — Encounter: Payer: Self-pay | Admitting: Internal Medicine

## 2020-10-29 ENCOUNTER — Ambulatory Visit
Admission: RE | Admit: 2020-10-29 | Discharge: 2020-10-29 | Disposition: A | Discharge status: Home / Self Care | Payer: 59 | Source: Ambulatory Visit | Attending: Internal Medicine | Admitting: Internal Medicine

## 2020-10-29 ENCOUNTER — Other Ambulatory Visit: Payer: Self-pay | Admitting: Internal Medicine

## 2020-10-29 DIAGNOSIS — M25552 Pain in left hip: Secondary | ICD-10-CM

## 2020-10-29 DIAGNOSIS — G8929 Other chronic pain: Secondary | ICD-10-CM

## 2020-10-29 DIAGNOSIS — M25551 Pain in right hip: Secondary | ICD-10-CM | POA: Diagnosis not present

## 2020-10-29 DIAGNOSIS — M545 Low back pain, unspecified: Secondary | ICD-10-CM | POA: Diagnosis not present

## 2020-11-01 ENCOUNTER — Encounter: Payer: Self-pay | Admitting: Internal Medicine

## 2020-11-05 ENCOUNTER — Encounter: Payer: Self-pay | Admitting: Internal Medicine

## 2020-11-24 ENCOUNTER — Ambulatory Visit: Payer: No Typology Code available for payment source | Admitting: Internal Medicine

## 2020-11-29 DIAGNOSIS — M67911 Unspecified disorder of synovium and tendon, right shoulder: Secondary | ICD-10-CM | POA: Diagnosis not present

## 2021-01-20 ENCOUNTER — Other Ambulatory Visit: Payer: Self-pay | Admitting: Internal Medicine

## 2021-02-09 DIAGNOSIS — M75111 Incomplete rotator cuff tear or rupture of right shoulder, not specified as traumatic: Secondary | ICD-10-CM | POA: Diagnosis not present

## 2021-02-22 ENCOUNTER — Encounter (HOSPITAL_BASED_OUTPATIENT_CLINIC_OR_DEPARTMENT_OTHER): Payer: Self-pay | Admitting: Orthopedic Surgery

## 2021-02-22 ENCOUNTER — Other Ambulatory Visit: Payer: Self-pay | Admitting: Orthopedic Surgery

## 2021-02-24 ENCOUNTER — Encounter (HOSPITAL_BASED_OUTPATIENT_CLINIC_OR_DEPARTMENT_OTHER)
Admission: RE | Admit: 2021-02-24 | Discharge: 2021-02-24 | Disposition: A | Discharge status: Home / Self Care | Payer: 59 | Source: Ambulatory Visit | Attending: Orthopedic Surgery | Admitting: Orthopedic Surgery

## 2021-02-24 ENCOUNTER — Other Ambulatory Visit: Payer: Self-pay

## 2021-02-24 DIAGNOSIS — Z01818 Encounter for other preprocedural examination: Secondary | ICD-10-CM | POA: Diagnosis not present

## 2021-02-24 LAB — BASIC METABOLIC PANEL
Anion gap: 5 (ref 5–15)
BUN: 8 mg/dL (ref 6–20)
CO2: 31 mmol/L (ref 22–32)
Calcium: 9.3 mg/dL (ref 8.9–10.3)
Chloride: 103 mmol/L (ref 98–111)
Creatinine, Ser: 0.79 mg/dL (ref 0.44–1.00)
GFR, Estimated: >60 mL/min (ref >60)
Glucose, Bld: 101 mg/dL — ABNORMAL HIGH (ref 70–99)
Potassium: 4.5 mmol/L (ref 3.5–5.1)
Sodium: 139 mmol/L (ref 135–145)

## 2021-02-28 ENCOUNTER — Encounter (HOSPITAL_BASED_OUTPATIENT_CLINIC_OR_DEPARTMENT_OTHER): Payer: Self-pay | Admitting: Orthopedic Surgery

## 2021-02-28 ENCOUNTER — Encounter (HOSPITAL_BASED_OUTPATIENT_CLINIC_OR_DEPARTMENT_OTHER)
Admission: RE | Disposition: A | Discharge status: Home / Self Care | Payer: Self-pay | Source: Ambulatory Visit | Attending: Orthopedic Surgery

## 2021-02-28 ENCOUNTER — Other Ambulatory Visit: Payer: Self-pay

## 2021-02-28 ENCOUNTER — Ambulatory Visit (HOSPITAL_BASED_OUTPATIENT_CLINIC_OR_DEPARTMENT_OTHER): Payer: 59 | Admitting: Anesthesiology

## 2021-02-28 ENCOUNTER — Ambulatory Visit (HOSPITAL_BASED_OUTPATIENT_CLINIC_OR_DEPARTMENT_OTHER)
Admission: RE | Admit: 2021-02-28 | Discharge: 2021-02-28 | Disposition: A | Discharge status: Home / Self Care | Payer: 59 | Source: Ambulatory Visit | Attending: Orthopedic Surgery | Admitting: Orthopedic Surgery

## 2021-02-28 DIAGNOSIS — M75111 Incomplete rotator cuff tear or rupture of right shoulder, not specified as traumatic: Secondary | ICD-10-CM | POA: Diagnosis not present

## 2021-02-28 DIAGNOSIS — Z87891 Personal history of nicotine dependence: Secondary | ICD-10-CM | POA: Diagnosis not present

## 2021-02-28 DIAGNOSIS — S43431A Superior glenoid labrum lesion of right shoulder, initial encounter: Secondary | ICD-10-CM | POA: Diagnosis not present

## 2021-02-28 DIAGNOSIS — Z79899 Other long term (current) drug therapy: Secondary | ICD-10-CM | POA: Insufficient documentation

## 2021-02-28 DIAGNOSIS — Z881 Allergy status to other antibiotic agents status: Secondary | ICD-10-CM | POA: Insufficient documentation

## 2021-02-28 DIAGNOSIS — M75101 Unspecified rotator cuff tear or rupture of right shoulder, not specified as traumatic: Secondary | ICD-10-CM | POA: Diagnosis not present

## 2021-02-28 DIAGNOSIS — Z885 Allergy status to narcotic agent status: Secondary | ICD-10-CM | POA: Insufficient documentation

## 2021-02-28 DIAGNOSIS — X58XXXA Exposure to other specified factors, initial encounter: Secondary | ICD-10-CM | POA: Diagnosis not present

## 2021-02-28 DIAGNOSIS — M25811 Other specified joint disorders, right shoulder: Secondary | ICD-10-CM | POA: Diagnosis not present

## 2021-02-28 DIAGNOSIS — Z886 Allergy status to analgesic agent status: Secondary | ICD-10-CM | POA: Diagnosis not present

## 2021-02-28 DIAGNOSIS — M7541 Impingement syndrome of right shoulder: Secondary | ICD-10-CM | POA: Diagnosis not present

## 2021-02-28 DIAGNOSIS — M94211 Chondromalacia, right shoulder: Secondary | ICD-10-CM | POA: Insufficient documentation

## 2021-02-28 DIAGNOSIS — G8918 Other acute postprocedural pain: Secondary | ICD-10-CM | POA: Diagnosis not present

## 2021-02-28 HISTORY — PX: SHOULDER ARTHROSCOPY WITH SUBACROMIAL DECOMPRESSION: SHX5684

## 2021-02-28 HISTORY — DX: Other specified postprocedural states: R11.2

## 2021-02-28 HISTORY — DX: Essential (primary) hypertension: I10

## 2021-02-28 HISTORY — DX: Gastro-esophageal reflux disease without esophagitis: K21.9

## 2021-02-28 HISTORY — DX: Other complications of anesthesia, initial encounter: T88.59XA

## 2021-02-28 HISTORY — DX: Other specified postprocedural states: Z98.890

## 2021-02-28 SURGERY — SHOULDER ARTHROSCOPY WITH SUBACROMIAL DECOMPRESSION
Anesthesia: General | Site: Shoulder | Laterality: Right

## 2021-02-28 MED ORDER — MEPERIDINE HCL 25 MG/ML IJ SOLN: 6.2500 mg | INTRAMUSCULAR | Status: DC | PRN

## 2021-02-28 MED ORDER — SCOPOLAMINE 1 MG/3DAYS TD PT72
MEDICATED_PATCH | TRANSDERMAL | Status: AC
  Filled 2021-02-28: qty 1

## 2021-02-28 MED ORDER — FENTANYL CITRATE (PF) 100 MCG/2ML IJ SOLN
INTRAMUSCULAR | Status: AC
  Filled 2021-02-28: qty 2

## 2021-02-28 MED ORDER — FENTANYL CITRATE (PF) 100 MCG/2ML IJ SOLN: 25.0000 ug | INTRAMUSCULAR | Status: DC | PRN

## 2021-02-28 MED ORDER — BUPIVACAINE-EPINEPHRINE (PF) 0.5% -1:200000 IJ SOLN
INTRAMUSCULAR | Status: DC | PRN
  Administered 2021-02-28: 15 mL via PERINEURAL

## 2021-02-28 MED ORDER — ACETAMINOPHEN 160 MG/5ML PO SOLN: 325.0000 mg | ORAL | Status: DC | PRN

## 2021-02-28 MED ORDER — OXYCODONE HCL 5 MG PO TABS
5.0000 mg | ORAL_TABLET | Freq: Once | ORAL | Status: DC | PRN
Start: 2021-02-28 — End: 2021-02-28

## 2021-02-28 MED ORDER — DEXAMETHASONE SODIUM PHOSPHATE 10 MG/ML IJ SOLN
INTRAMUSCULAR | Status: AC
  Filled 2021-02-28: qty 1

## 2021-02-28 MED ORDER — ONDANSETRON HCL 4 MG/2ML IJ SOLN
INTRAMUSCULAR | Status: AC
  Filled 2021-02-28: qty 2

## 2021-02-28 MED ORDER — PROPOFOL 10 MG/ML IV BOLUS
INTRAVENOUS | Status: AC
  Filled 2021-02-28: qty 20

## 2021-02-28 MED ORDER — FENTANYL CITRATE (PF) 100 MCG/2ML IJ SOLN
INTRAMUSCULAR | Status: DC | PRN
  Administered 2021-02-28: 50 ug via INTRAVENOUS

## 2021-02-28 MED ORDER — HYDROCODONE-ACETAMINOPHEN 10-325 MG PO TABS: 1.0000 | ORAL_TABLET | Freq: Four times a day (QID) | ORAL | Status: DC | PRN

## 2021-02-28 MED ORDER — OXYCODONE HCL 5 MG/5ML PO SOLN: 5.0000 mg | Freq: Once | ORAL | Status: DC | PRN

## 2021-02-28 MED ORDER — ONDANSETRON HCL 4 MG/2ML IJ SOLN
INTRAMUSCULAR | Status: DC | PRN
  Administered 2021-02-28: 4 mg via INTRAVENOUS

## 2021-02-28 MED ORDER — PHENYLEPHRINE HCL (PRESSORS) 10 MG/ML IV SOLN
INTRAVENOUS | Status: DC | PRN
  Administered 2021-02-28: 40 ug via INTRAVENOUS
  Administered 2021-02-28: 80 ug via INTRAVENOUS
  Administered 2021-02-28: 40 ug via INTRAVENOUS

## 2021-02-28 MED ORDER — SCOPOLAMINE 1 MG/3DAYS TD PT72
1.0000 | MEDICATED_PATCH | TRANSDERMAL | Status: DC
  Administered 2021-02-28: 1.5 mg via TRANSDERMAL

## 2021-02-28 MED ORDER — ACETAMINOPHEN 325 MG PO TABS: 325.0000 mg | ORAL_TABLET | ORAL | Status: DC | PRN

## 2021-02-28 MED ORDER — TIZANIDINE HCL 2 MG PO TABS: 2.0000 mg | ORAL_TABLET | Freq: Three times a day (TID) | ORAL | 0 refills | Status: DC | PRN

## 2021-02-28 MED ORDER — FENTANYL CITRATE (PF) 100 MCG/2ML IJ SOLN
100.0000 ug | Freq: Once | INTRAMUSCULAR | Status: AC
  Administered 2021-02-28: 100 ug via INTRAVENOUS

## 2021-02-28 MED ORDER — BUPIVACAINE LIPOSOME 1.3 % IJ SUSP
INTRAMUSCULAR | Status: DC | PRN
  Administered 2021-02-28: 10 mL via PERINEURAL

## 2021-02-28 MED ORDER — EPHEDRINE SULFATE 50 MG/ML IJ SOLN
INTRAMUSCULAR | Status: DC | PRN
  Administered 2021-02-28: 10 mg via INTRAVENOUS

## 2021-02-28 MED ORDER — LIDOCAINE 2% (20 MG/ML) 5 ML SYRINGE
INTRAMUSCULAR | Status: AC
  Filled 2021-02-28: qty 5

## 2021-02-28 MED ORDER — ONDANSETRON HCL 4 MG/2ML IJ SOLN: 4.0000 mg | Freq: Once | INTRAMUSCULAR | Status: DC | PRN

## 2021-02-28 MED ORDER — HYDROCODONE-ACETAMINOPHEN 5-325 MG PO TABS: 1.0000 | ORAL_TABLET | Freq: Four times a day (QID) | ORAL | Status: DC | PRN

## 2021-02-28 MED ORDER — DEXAMETHASONE SODIUM PHOSPHATE 4 MG/ML IJ SOLN
INTRAMUSCULAR | Status: DC | PRN
  Administered 2021-02-28: 10 mg via INTRAVENOUS

## 2021-02-28 MED ORDER — MIDAZOLAM HCL 2 MG/2ML IJ SOLN
2.0000 mg | Freq: Once | INTRAMUSCULAR | Status: AC
  Administered 2021-02-28: 2 mg via INTRAVENOUS

## 2021-02-28 MED ORDER — PROPOFOL 10 MG/ML IV BOLUS
INTRAVENOUS | Status: DC | PRN
Start: 2021-02-28 — End: 2021-02-28
  Administered 2021-02-28: 100 mg via INTRAVENOUS

## 2021-02-28 MED ORDER — CEFAZOLIN SODIUM-DEXTROSE 2-4 GM/100ML-% IV SOLN
INTRAVENOUS | Status: AC
  Filled 2021-02-28: qty 100

## 2021-02-28 MED ORDER — SUGAMMADEX SODIUM 200 MG/2ML IV SOLN
INTRAVENOUS | Status: DC | PRN
  Administered 2021-02-28: 200 mg via INTRAVENOUS

## 2021-02-28 MED ORDER — MIDAZOLAM HCL 2 MG/2ML IJ SOLN
INTRAMUSCULAR | Status: AC
  Filled 2021-02-28: qty 2

## 2021-02-28 MED ORDER — LACTATED RINGERS IV SOLN: INTRAVENOUS | Status: DC

## 2021-02-28 MED ORDER — ROCURONIUM BROMIDE 10 MG/ML (PF) SYRINGE
PREFILLED_SYRINGE | INTRAVENOUS | Status: AC
  Filled 2021-02-28: qty 10

## 2021-02-28 MED ORDER — SODIUM CHLORIDE 0.9 % IR SOLN
Status: DC | PRN
  Administered 2021-02-28: 3000 mL

## 2021-02-28 MED ORDER — ROCURONIUM BROMIDE 100 MG/10ML IV SOLN
INTRAVENOUS | Status: DC | PRN
Start: 2021-02-28 — End: 2021-02-28
  Administered 2021-02-28: 70 mg via INTRAVENOUS

## 2021-02-28 MED ORDER — CEFAZOLIN SODIUM-DEXTROSE 2-4 GM/100ML-% IV SOLN
2.0000 g | INTRAVENOUS | Status: AC
  Administered 2021-02-28: 2 g via INTRAVENOUS

## 2021-02-28 MED ORDER — LIDOCAINE HCL (CARDIAC) PF 100 MG/5ML IV SOSY
PREFILLED_SYRINGE | INTRAVENOUS | Status: DC | PRN
  Administered 2021-02-28: 20 mg via INTRAVENOUS

## 2021-02-28 MED ORDER — HYDROCODONE-ACETAMINOPHEN 5-325 MG PO TABS: 1.0000 | ORAL_TABLET | Freq: Four times a day (QID) | ORAL | 0 refills | Status: DC | PRN

## 2021-02-28 SURGICAL SUPPLY — 33 items
AID PSTN UNV HD RSTRNT DISP (MISCELLANEOUS) ×2
APL PRP STRL LF DISP 70% ISPRP (MISCELLANEOUS) ×2
CANNULA 5.75X7 CRYSTAL CLEAR (CANNULA) ×3 IMPLANT
CHLORAPREP W/TINT 26 (MISCELLANEOUS) ×3 IMPLANT
DRAPE IMP U-DRAPE 54X76 (DRAPES) ×6 IMPLANT
DRAPE INCISE IOBAN 66X45 STRL (DRAPES) ×3 IMPLANT
DRAPE STERI 35X30 U-POUCH (DRAPES) ×3 IMPLANT
DRAPE SURG 17X23 STRL (DRAPES) ×3 IMPLANT
DRAPE U-SHAPE 47X51 STRL (DRAPES) ×3 IMPLANT
DRAPE U-SHAPE 76X120 STRL (DRAPES) ×6 IMPLANT
DRSG PAD ABDOMINAL 8X10 ST (GAUZE/BANDAGES/DRESSINGS) ×18 IMPLANT
GAUZE SPONGE 4X4 12PLY STRL (GAUZE/BANDAGES/DRESSINGS) ×3 IMPLANT
GAUZE XEROFORM 1X8 LF (GAUZE/BANDAGES/DRESSINGS) ×3 IMPLANT
GLOVE SRG 8 PF TXTR STRL LF DI (GLOVE) ×2 IMPLANT
GLOVE SURG ENC MOIS LTX SZ7 (GLOVE) ×3 IMPLANT
GLOVE SURG ENC MOIS LTX SZ7.5 (GLOVE) ×3 IMPLANT
GLOVE SURG UNDER POLY LF SZ7 (GLOVE) ×3 IMPLANT
GLOVE SURG UNDER POLY LF SZ8 (GLOVE) ×3
GOWN STRL REUS W/ TWL LRG LVL3 (GOWN DISPOSABLE) ×6 IMPLANT
GOWN STRL REUS W/ TWL XL LVL3 (GOWN DISPOSABLE) ×2 IMPLANT
GOWN STRL REUS W/TWL LRG LVL3 (GOWN DISPOSABLE) ×9
GOWN STRL REUS W/TWL XL LVL3 (GOWN DISPOSABLE) ×3
MANIFOLD NEPTUNE II (INSTRUMENTS) ×3 IMPLANT
PACK ARTHROSCOPY DSU (CUSTOM PROCEDURE TRAY) ×3 IMPLANT
PACK BASIN DAY SURGERY FS (CUSTOM PROCEDURE TRAY) ×3 IMPLANT
PROBE BIPOLAR ATHRO 135MM 90D (MISCELLANEOUS) ×3 IMPLANT
RESTRAINT HEAD UNIVERSAL NS (MISCELLANEOUS) ×3 IMPLANT
SLEEVE SCD COMPRESS KNEE MED (STOCKING) ×3 IMPLANT
SLING ARM FOAM STRAP LRG (SOFTGOODS) ×3 IMPLANT
SUT ETHILON 3 0 PS 1 (SUTURE) ×3 IMPLANT
TOWEL GREEN STERILE FF (TOWEL DISPOSABLE) ×3 IMPLANT
TUBE CONNECTING 20X1/4 (TUBING) ×3 IMPLANT
TUBING ARTHROSCOPY IRRIG 16FT (MISCELLANEOUS) ×3 IMPLANT

## 2021-02-28 NOTE — Progress Notes (Signed)
Assisted Dr. Ambrose Pancoast with right, ultrasound guided, interscalene  block. Side rails up, monitors on throughout procedure. See vital signs in flow sheet. Tolerated Procedure well.

## 2021-02-28 NOTE — Transfer of Care (Signed)
Immediate Anesthesia Transfer of Care Note  Patient: RONNETT PULLIN  Procedure(s) Performed: SHOULDER ARTHROSCOPY WITH ROTATOR CUFF REPAIR VERSUS ROTATOR CUFF DEBRIDEMENT, SUBACROMIAL DECOMPRESSION (Right: Shoulder)  Patient Location: PACU  Anesthesia Type:GA combined with regional for post-op pain  Level of Consciousness: awake, alert  and oriented  Airway & Oxygen Therapy: Patient Spontanous Breathing and Patient connected to face mask oxygen  Post-op Assessment: Report given to RN and Post -op Vital signs reviewed and stable  Post vital signs: Reviewed and stable  Last Vitals:  Vitals Value Taken Time  BP 116/67 02/28/21 1621  Temp    Pulse 93 02/28/21 1624  Resp 17 02/28/21 1624  SpO2 98 % 02/28/21 1624  Vitals shown include unvalidated device data.  Last Pain:  Vitals:   02/28/21 1248  TempSrc: Oral  PainSc: 4          Complications: No notable events documented.

## 2021-02-28 NOTE — Op Note (Signed)
Procedure(s):   Danielle Silva female 60 y.o. 02/28/2021  Preoperative diagnosis: #1 right shoulder partial rotator cuff tear #2 right shoulder impingement with unfavorable acromial anatomy  Postoperative diagnosis: #1 right shoulder partial rotator cuff tear #2 right shoulder superior labral tear , #3 right shoulder glenohumeral chondromalacia #4 right shoulder impingement with unfavorable acromial anatomy  Procedure performed: #1 right shoulder arthroscopic extensive debridement partial rotator cuff tear, labral tear, glenohumeral chondromalacia, synovitis #2 right shoulder arthroscopic subacromial decompression  Procedure(s) and Anesthesia Type:    * SHOULDER ARTHROSCOPY WITH ROTATOR CUFF REPAIR VERSUS ROTATOR CUFF DEBRIDEMENT, SUBACROMIAL DECOMPRESSION - General  Surgeon(s) and Role:    Tania Ade, MD - Primary     Surgeon: Rhae Hammock   Assistants: Sheryle Hail PA-C [name] was present and scrubbed throughout the procedure and was essential in positioning, assisting with the camera and instrumentation,, and closure)  Anesthesia: General endotracheal anesthesia with preoperative interscalene block given by the attending anesthesiologist    Procedure Detail   Estimated Blood Loss: Min         Drains: none  Blood Given: none         Specimens: none        Complications:  * No complications entered in OR log *         Disposition: PACU - hemodynamically stable.         Condition: stable    Procedure:   INDICATIONS FOR SURGERY: The patient is 60 y.o. female who has had a long history of right shoulder pain which is failed extensive conservative management.  Indicated for surgical treatment to try and decrease pain and restore function.  OPERATIVE FINDINGS: Examination under anesthesia: Mild stiffness in forward flexion to about 165 degrees   DESCRIPTION OF PROCEDURE: The patient was identified in preoperative  holding area where I  personally marked the operative site after  verifying site, side, and procedure with the patient. An interscalene block was given by the attending anesthesiologist the holding area.  The patient was taken back to the operating room where general anesthesia was induced without complication and was placed in the beach-chair position with the back  elevated about 60 degrees and all extremities and head and neck carefully padded and  positioned.   The right upper extremity was then prepped and  draped in a standard sterile fashion. The appropriate time-out  procedure was carried out. The patient did receive IV antibiotics  within 30 minutes of incision.   A small posterior portal incision was made and the arthroscope was introduced into the joint. An anterior portal was then established above the subscapularis using needle localization. Small cannula was placed anteriorly. Diagnostic arthroscopy was then carried out.  She was noted to have fairly extensive synovitis in the joint.  She had type I superior labral tear which was debrided back to healthy labrum.  The biceps origin was not compromised.  The biceps tendon was pulled into the joint and had some tenosynovitis but no partial tearing.  This was debrided.  The synovitis was debrided back with a shaver and ArthroCare.  The subscapularis had a longitudinal split which was debrided but not something that needed to be repaired.  Glenohumeral joint surfaces were noted to have some grade II and III chondromalacia which was lightly debrided.  There was no other areas of exposed bone.  She had some fraying of the posterior labrum which was debrided as well.  The undersurface of the supraspinatus was examined and  noted to have partial tearing involving 10 to 20% of the internal fibers which was debrided back to bleeding healthy tendon.  Formal repair was not felt to be necessary for this.  The arthroscope was then introduced into the subacromial space a  standard lateral portal was established with needle localization. The shaver was used through the lateral portal to perform extensive bursectomy.   She did have a fair amount of inflamed bursa.  Once this was resected the bursal surface of the rotator cuff was carefully probed and examined back to front.  There was an area anterolateral which was a bit thinned and frayed in the area of impingement but there was no exposed tuberosity and I did not feel that formal repair was indicated here.  The coracoacromial ligament was taken down off the anterior acromion with the ArthroCare exposing a moderate hooked anterior acromial spur. A high-speed bur was then used through the lateral portal to take down the anterior acromial spur from lateral to medial in a standard acromioplasty.  The acromioplasty was also viewed from the lateral portal and the bur was used as necessary to ensure that the acromion was completely flat from posterior to anterior.  The arthroscopic equipment was removed from the joint and the portals were closed with 3-0 nylon in an interrupted fashion. Sterile dressings were then applied including Xeroform 4 x 4's ABDs and tape. The patient was then allowed to awaken from general anesthesia, placed in a sling, transferred to the stretcher and taken to the recovery room in stable condition.   POSTOPERATIVE PLAN: The patient will be discharged home today and will followup in one week for suture removal and wound check.

## 2021-02-28 NOTE — Anesthesia Postprocedure Evaluation (Signed)
Anesthesia Post Note  Patient: AGUSTA HACKENBERG  Procedure(s) Performed: SHOULDER ARTHROSCOPY ROTATOR CUFF DEBRIDEMENT, SUBACROMIAL DECOMPRESSION (Right: Shoulder)     Patient location during evaluation: PACU Anesthesia Type: General and Regional Level of consciousness: awake and alert, oriented and patient cooperative Pain management: pain level controlled Vital Signs Assessment: post-procedure vital signs reviewed and stable Respiratory status: spontaneous breathing, nonlabored ventilation and respiratory function stable Cardiovascular status: blood pressure returned to baseline and stable Postop Assessment: no apparent nausea or vomiting Anesthetic complications: no   No notable events documented.  Last Vitals:  Vitals:   02/28/21 1621 02/28/21 1630  BP: 116/67 112/83  Pulse:  86  Resp:  12  Temp: 36.4 C   SpO2: 99% 93%    Last Pain:  Vitals:   02/28/21 1630  TempSrc:   PainSc: 0-No pain                 Pervis Hocking

## 2021-02-28 NOTE — H&P (Signed)
Danielle Silva is an 60 y.o. female.   Chief Complaint: Right shoulder pain HPI: Right shoulder pain which has been refractory to nonoperative management.  She was found on MRI to have high-grade partial rotator cuff tear.  Indicated for surgery to try and decrease pain and restore function.  Past Medical History:  Diagnosis Date   Anxiety    Complication of anesthesia    Depression    Diverticulitis    GERD (gastroesophageal reflux disease)    Hypertension    PONV (postoperative nausea and vomiting)     Past Surgical History:  Procedure Laterality Date   ANKLE FRACTURE SURGERY Left 2011   Left ankle   CESAREAN SECTION  1985   CHOLECYSTECTOMY  1985   TONSILECTOMY/ADENOIDECTOMY WITH MYRINGOTOMY Bilateral 1980   TUBAL LIGATION  1985    Family History  Problem Relation Age of Onset   Diabetes Mother    Hypertension Mother    Hyperlipidemia Mother    Heart disease Mother        CABG   Hypertension Father    Kidney disease Father        kidney transplant   Thyroid disease Father    Heart disease Father        A-fib   Arthritis Brother    Social History:  reports that she has quit smoking. Her smoking use included cigarettes. She has never used smokeless tobacco. She reports current alcohol use. She reports that she does not use drugs.  Allergies:  Allergies  Allergen Reactions   Macrobid [Nitrofurantoin Monohydrate Macrocrystals] Hives   Nsaids Other (See Comments)    Avoids bc of colitis    Percocet [Oxycodone-Acetaminophen] Itching and Nausea And Vomiting    Can take with benadryl    Medications Prior to Admission  Medication Sig Dispense Refill   azelastine (ASTELIN) 0.1 % nasal spray SPRAY 2 SPRAYS IN EACH NOSTRIL AS DIRECTED 30 mL 5   buPROPion (WELLBUTRIN XL) 150 MG 24 hr tablet Take 1 tablet (150 mg total) by mouth daily. 90 tablet 2   calcium carbonate (OS-CAL) 600 MG TABS tablet Take 600 mg by mouth daily.      cetirizine (ZYRTEC) 5 MG tablet Take 5 mg by  mouth daily.     Cholecalciferol (VITAMIN D3) 5000 units CAPS Take by mouth.     Multiple Vitamin (MULTIVITAMIN WITH MINERALS) TABS tablet Take 1 tablet by mouth daily.     valsartan-hydrochlorothiazide (DIOVAN HCT) 80-12.5 MG tablet Take 1 tablet by mouth daily. 90 tablet 3   zinc gluconate 50 MG tablet Take 50 mg by mouth daily.      No results found for this or any previous visit (from the past 48 hour(s)). No results found.  Review of Systems  All other systems reviewed and are negative.  Blood pressure 111/74, pulse 67, temperature 98.1 F (36.7 C), temperature source Oral, resp. rate 14, height 5\' 3"  (1.6 m), weight 94.8 kg, last menstrual period 01/05/2012, SpO2 99 %. Physical Exam HENT:     Head: Atraumatic.  Eyes:     Extraocular Movements: Extraocular movements intact.  Cardiovascular:     Pulses: Normal pulses.  Musculoskeletal:     Comments: Right shoulder pain with cuff testing and impingement testing.  Neurological:     Mental Status: She is alert.  Psychiatric:        Mood and Affect: Mood normal.     Assessment/Plan Right shoulder pain which has been refractory to nonoperative management.  She was found on MRI to have high-grade partial rotator cuff tear.  Indicated for surgery to try and decrease pain and restore function. Risks / benefits of surgery discussed Consent on chart  NPO for OR Preop antibiotics   Rhae Hammock, MD 02/28/2021, 2:31 PM

## 2021-02-28 NOTE — Anesthesia Preprocedure Evaluation (Addendum)
Anesthesia Evaluation  Patient identified by MRN, date of birth, ID band Patient awake    Reviewed: Allergy & Precautions, H&P , NPO status , Patient's Chart, lab work & pertinent test results, reviewed documented beta blocker date and time   History of Anesthesia Complications (+) PONV and history of anesthetic complications  Airway Mallampati: I  TM Distance: >3 FB Neck ROM: full    Dental no notable dental hx. (+) Teeth Intact, Dental Advisory Given   Pulmonary neg pulmonary ROS, former smoker,    Pulmonary exam normal breath sounds clear to auscultation       Cardiovascular Exercise Tolerance: Good hypertension, Pt. on medications  Rhythm:regular Rate:Normal     Neuro/Psych PSYCHIATRIC DISORDERS Anxiety Depression negative neurological ROS     GI/Hepatic Neg liver ROS, GERD  Medicated,  Endo/Other  negative endocrine ROS  Renal/GU negative Renal ROS  negative genitourinary   Musculoskeletal   Abdominal   Peds  Hematology negative hematology ROS (+)   Anesthesia Other Findings   Reproductive/Obstetrics negative OB ROS                            Anesthesia Physical Anesthesia Plan  ASA: 2  Anesthesia Plan: General   Post-op Pain Management: GA combined w/ Regional for post-op pain   Induction: Intravenous  PONV Risk Score and Plan: 3 and Ondansetron, Dexamethasone, Midazolam and Treatment may vary due to age or medical condition  Airway Management Planned: Oral ETT and LMA  Additional Equipment: None  Intra-op Plan:   Post-operative Plan: Extubation in OR  Informed Consent: I have reviewed the patients History and Physical, chart, labs and discussed the procedure including the risks, benefits and alternatives for the proposed anesthesia with the patient or authorized representative who has indicated his/her understanding and acceptance.     Dental Advisory Given  Plan  Discussed with: CRNA and Anesthesiologist  Anesthesia Plan Comments: (Discussed both nerve block for pain relief post-op and GA; including NV, sore throat, dental injury, and pulmonary complications)        Anesthesia Quick Evaluation

## 2021-02-28 NOTE — Anesthesia Procedure Notes (Signed)
Anesthesia Regional Block: Interscalene brachial plexus block   Pre-Anesthetic Checklist: , timeout performed,  Correct Patient, Correct Site, Correct Laterality,  Correct Procedure, Correct Position, site marked,  Risks and benefits discussed,  Surgical consent,  Pre-op evaluation,  At surgeon's request and post-op pain management  Laterality: Right  Prep: chloraprep       Needles:  Injection technique: Single-shot  Needle Type: Echogenic Stimulator Needle     Needle Length: 5cm  Needle Gauge: 22     Additional Needles:   Procedures:, nerve stimulator,,, ultrasound used (permanent image in chart),,     Nerve Stimulator or Paresthesia:  Response: hand, 0.45 mA  Additional Responses:   Narrative:  Start time: 02/28/2021 1:28 PM End time: 02/28/2021 1:35 PM Injection made incrementally with aspirations every 5 mL.  Performed by: Personally  Anesthesiologist: Janeece Riggers, MD  Additional Notes: Functioning IV was confirmed and monitors were applied.  A 81mm 22ga Arrow echogenic stimulator needle was used. Sterile prep and drape,hand hygiene and sterile gloves were used. Ultrasound guidance: relevant anatomy identified, needle position confirmed, local anesthetic spread visualized around nerve(s)., vascular puncture avoided.  Image printed for medical record. Negative aspiration and negative test dose prior to incremental administration of local anesthetic. The patient tolerated the procedure well.

## 2021-02-28 NOTE — Anesthesia Procedure Notes (Signed)
Procedure Name: Intubation Date/Time: 02/28/2021 3:20 PM Performed by: Maryella Shivers, CRNA Pre-anesthesia Checklist: Patient identified, Emergency Drugs available, Suction available and Patient being monitored Patient Re-evaluated:Patient Re-evaluated prior to induction Oxygen Delivery Method: Circle system utilized Preoxygenation: Pre-oxygenation with 100% oxygen Induction Type: IV induction Ventilation: Mask ventilation without difficulty Laryngoscope Size: Mac and 3 Grade View: Grade I Tube type: Oral Tube size: 7.0 mm Number of attempts: 1 Airway Equipment and Method: Stylet and Oral airway Placement Confirmation: ETT inserted through vocal cords under direct vision, positive ETCO2 and breath sounds checked- equal and bilateral Secured at: 20 cm Tube secured with: Tape Dental Injury: Teeth and Oropharynx as per pre-operative assessment

## 2021-02-28 NOTE — Discharge Instructions (Addendum)
Discharge Instructions after Arthroscopic Shoulder Surgery   A sling has been provided for you. You may remove the sling after 72 hours. The sling may be worn for your protection, if you are in a crowd.  Use ice on the shoulder intermittently over the first 48 hours after surgery.  Pain medication has been prescribed for you.  Use your medication liberally over the first 48 hours, and then begin to taper your use. You may take Extra Strength Tylenol or Tylenol only in place of the pain pills. DO NOT take ANY nonsteroidal anti-inflammatory pain medications: Advil, Motrin, Ibuprofen, Aleve, Naproxen, or Naprosyn.  You may remove your dressing after two days.  You may shower 5 days after surgery. The incision CANNOT get wet prior to 5 days. Simply allow the water to wash over the site and then pat dry. Do not rub the incision. Make sure your axilla (armpit) is completely dry after showering.  Take one aspirin a day for 2 weeks after surgery, unless you have an aspirin sensitivity/allergy or asthma.  Three to 5 times each day you should perform assisted overhead reaching and external rotation (outward turning) exercises with the operative arm. Both exercises should be done with the non-operative arm used as the "therapist arm" while the operative arm remains relaxed. Ten of each exercise should be done three to five times each day.    Overhead reach is helping to lift your stiff arm up as high as it will go. To stretch your overhead reach, lie flat on your back, relax, and grasp the wrist of the tight shoulder with your opposite hand. Using the power in your opposite arm, bring the stiff arm up as far as it is comfortable. Start holding it for ten seconds and then work up to where you can hold it for a count of 30. Breathe slowly and deeply while the arm is moved. Repeat this stretch ten times, trying to help the arm up a little higher each time.       External rotation is turning the arm out to  the side while your elbow stays close to your body. External rotation is best stretched while you are lying on your back. Hold a cane, yardstick, broom handle, or dowel in both hands. Bend both elbows to a right angle. Use steady, gentle force from your normal arm to rotate the hand of the stiff shoulder out away from your body. Continue the rotation as far as it will go comfortably, holding it there for a count of 10. Repeat this exercise ten times.     Please call [phone] during normal business hours or [phone] after hours for any problems. Including the following:  - excessive redness of the incisions - drainage for more than 4 days - fever of more than 101.5 F  *Please note that pain medications will not be refilled after hours or on weekends.   Regional Anesthesia Blocks  1. Numbness or the inability to move the "blocked" extremity may last from 3-48 hours after placement. The length of time depends on the medication injected and your individual response to the medication. If the numbness is not going away after 48 hours, call your surgeon.  2. The extremity that is blocked will need to be protected until the numbness is gone and the  Strength has returned. Because you cannot feel it, you will need to take extra care to avoid injury. Because it may be weak, you may have difficulty moving it or using  it. You may not know what position it is in without looking at it while the block is in effect.  3. For blocks in the legs and feet, returning to weight bearing and walking needs to be done carefully. You will need to wait until the numbness is entirely gone and the strength has returned. You should be able to move your leg and foot normally before you try and bear weight or walk. You will need someone to be with you when you first try to ensure you do not fall and possibly risk injury.  4. Bruising and tenderness at the needle site are common side effects and will resolve in a few  days.  5. Persistent numbness or new problems with movement should be communicated to the surgeon or the [facility] [phone] [place] [phone]).   Post Anesthesia Home Care Instructions  Activity: Get plenty of rest for the remainder of the day. A responsible individual must stay with you for 24 hours following the procedure.  For the next 24 hours, DO NOT: -Drive a car -Paediatric nurse -Drink alcoholic beverages -Take any medication unless instructed by your physician -Make any legal decisions or sign important papers.  Meals: Start with liquid foods such as gelatin or soup. Progress to regular foods as tolerated. Avoid greasy, spicy, heavy foods. If nausea and/or vomiting occur, drink only clear liquids until the nausea and/or vomiting subsides. Call your physician if vomiting continues.  Special Instructions/Symptoms: Your throat may feel dry or sore from the anesthesia or the breathing tube placed in your throat during surgery. If this causes discomfort, gargle with warm salt water. The discomfort should disappear within 24 hours.  If you had a scopolamine patch placed behind your ear for the management of post- operative nausea and/or vomiting:  1. The medication in the patch is effective for 72 hours, after which it should be removed.  Wrap patch in a tissue and discard in the trash. Wash hands thoroughly with soap and water. 2. You may remove the patch earlier than 72 hours if you experience unpleasant side effects which may include dry mouth, dizziness or visual disturbances. 3. Avoid touching the patch. Wash your hands with soap and water after contact with the patch.    Information for Discharge Teaching: EXPAREL (bupivacaine liposome injectable suspension)   Your surgeon or anesthesiologist gave you EXPAREL(bupivacaine) to help control your pain after surgery.  EXPAREL is a local anesthetic that provides pain relief by numbing the  tissue around the surgical site. EXPAREL is designed to release pain medication over time and can control pain for up to 72 hours. Depending on how you respond to EXPAREL, you may require less pain medication during your recovery.  Possible side effects: Temporary loss of sensation or ability to move in the area where bupivacaine was injected. Nausea, vomiting, constipation Rarely, numbness and tingling in your mouth or lips, lightheadedness, or anxiety may occur. Call your doctor right away if you think you may be experiencing any of these sensations, or if you have other questions regarding possible side effects.  Follow all other discharge instructions given to you by your surgeon or nurse. Eat a healthy diet and drink plenty of water or other fluids.  If you return to the hospital for any reason within 96 hours following the administration of EXPAREL, it is important for health care providers to know that you have received this anesthetic. A teal colored band has been placed on your  arm with the date, time and amount of EXPAREL you have received in order to alert and inform your health care providers. Please leave this armband in place for the full 96 hours following administration, and then you may remove the band.

## 2021-03-01 ENCOUNTER — Encounter (HOSPITAL_BASED_OUTPATIENT_CLINIC_OR_DEPARTMENT_OTHER): Payer: Self-pay | Admitting: Orthopedic Surgery

## 2021-03-14 DIAGNOSIS — M25611 Stiffness of right shoulder, not elsewhere classified: Secondary | ICD-10-CM | POA: Diagnosis not present

## 2021-03-14 DIAGNOSIS — M6281 Muscle weakness (generalized): Secondary | ICD-10-CM | POA: Diagnosis not present

## 2021-03-17 DIAGNOSIS — M25611 Stiffness of right shoulder, not elsewhere classified: Secondary | ICD-10-CM | POA: Diagnosis not present

## 2021-03-17 DIAGNOSIS — M6281 Muscle weakness (generalized): Secondary | ICD-10-CM | POA: Diagnosis not present

## 2021-03-24 DIAGNOSIS — M6281 Muscle weakness (generalized): Secondary | ICD-10-CM | POA: Diagnosis not present

## 2021-03-24 DIAGNOSIS — M25611 Stiffness of right shoulder, not elsewhere classified: Secondary | ICD-10-CM | POA: Diagnosis not present

## 2021-03-31 DIAGNOSIS — M6281 Muscle weakness (generalized): Secondary | ICD-10-CM | POA: Diagnosis not present

## 2021-03-31 DIAGNOSIS — M25611 Stiffness of right shoulder, not elsewhere classified: Secondary | ICD-10-CM | POA: Diagnosis not present

## 2021-04-04 DIAGNOSIS — L03211 Cellulitis of face: Secondary | ICD-10-CM | POA: Diagnosis not present

## 2021-04-19 ENCOUNTER — Telehealth (INDEPENDENT_AMBULATORY_CARE_PROVIDER_SITE_OTHER): Payer: 59 | Admitting: Nurse Practitioner

## 2021-04-19 ENCOUNTER — Encounter: Payer: Self-pay | Admitting: Internal Medicine

## 2021-04-19 ENCOUNTER — Encounter: Payer: Self-pay | Admitting: Nurse Practitioner

## 2021-04-19 VITALS — BP 110/77 | HR 88 | Wt 203.0 lb

## 2021-04-19 DIAGNOSIS — L0100 Impetigo, unspecified: Secondary | ICD-10-CM

## 2021-04-19 MED ORDER — SULFAMETHOXAZOLE-TRIMETHOPRIM 800-160 MG PO TABS: 1.0000 | ORAL_TABLET | Freq: Two times a day (BID) | ORAL | 0 refills | Status: AC

## 2021-04-19 MED ORDER — MUPIROCIN 2 % EX OINT: 1.0000 "application " | TOPICAL_OINTMENT | Freq: Three times a day (TID) | CUTANEOUS | 0 refills | Status: DC

## 2021-04-19 NOTE — Progress Notes (Addendum)
Virtual Visit via video    This visit type was conducted due to national recommendations for restrictions regarding the COVID-19 Pandemic (e.g. social distancing) in an effort to limit this patient's exposure and mitigate transmission in our community.  Due to her co-morbid illnesses, this patient is at least at moderate risk for complications without adequate follow up.  This format is felt to be most appropriate for this patient at this time.  All issues noted in this document were discussed and addressed.  A limited physical exam was performed with this format.    This visit type was conducted due to national recommendations for restrictions regarding the COVID-19 Pandemic (e.g. social distancing) in an effort to limit this patient's exposure and mitigate transmission in our community.  Patients identity confirmed using two different identifiers.  This format is felt to be most appropriate for this patient at this time.  All issues noted in this document were discussed and addressed.  No physical exam was performed (except for noted visual exam findings with Video Visits).    Date:  04/19/2021   ID:  Danielle Silva, DOB March 30, 1961, MRN [id number]  Patient Location:  Home   Provider location:   Office  Chief Complaint:  Impetigo/rash on her nose   History of Present Illness:    Danielle Silva is a 60 y.o. female who presents via video conferencing for a telehealth visit today.    The patient does not have symptoms concerning for COVID-19 infection (fever, chills, cough, or new shortness of breath).   She was seen previously and diagnosed for impetigo. She was given muprion ointment. Then she went to the urgent care because the impetigo was not going away and she was treated with keflex. She is almost done with completion of the medication but still has the rash on her nose. No other concerns. Denies vision problems.     Past Medical History:  Diagnosis Date   Anxiety     Complication of anesthesia    Depression    Diverticulitis    GERD (gastroesophageal reflux disease)    Hypertension    PONV (postoperative nausea and vomiting)    Past Surgical History:  Procedure Laterality Date   ANKLE FRACTURE SURGERY Left 2011   Left ankle   [place] ARTHROSCOPY WITH SUBACROMIAL DECOMPRESSION Right 02/28/2021   Procedure: SHOULDER ARTHROSCOPY ROTATOR CUFF DEBRIDEMENT, SUBACROMIAL DECOMPRESSION;  Surgeon: Tania Ade, MD;  Location: [facility];  Service: Orthopedics;  Laterality: Right;   TONSILECTOMY/ADENOIDECTOMY WITH MYRINGOTOMY Bilateral 1980   TUBAL LIGATION  1985     Current Meds  Medication Sig   azelastine (ASTELIN) 0.1 % nasal spray SPRAY 2 SPRAYS IN EACH NOSTRIL AS DIRECTED   buPROPion (WELLBUTRIN XL) 150 MG 24 hr tablet Take 1 tablet (150 mg total) by mouth daily.   calcium carbonate (OS-CAL) 600 MG TABS tablet Take 600 mg by mouth daily.    cetirizine (ZYRTEC) 5 MG tablet Take 5 mg by mouth daily.   Cholecalciferol (VITAMIN D3) 5000 units CAPS Take by mouth.   mupirocin ointment (BACTROBAN) 2 % Apply 1 application topically 3 (three) times daily.   sulfamethoxazole-trimethoprim (BACTRIM DS) 800-160 MG tablet Take 1 tablet by mouth 2 (two) times daily for 7 days.   valsartan-hydrochlorothiazide (DIOVAN HCT) 80-12.5 MG tablet Take 1 tablet by mouth daily.   zinc gluconate 50 MG tablet Take 50 mg by mouth daily.  Allergies:   Macrobid [nitrofurantoin monohydrate macrocrystals], Nsaids, and Percocet [oxycodone-acetaminophen]   Social History   Tobacco Use   Smoking status: Former    Years: 15.00    Types: Cigarettes   Smokeless tobacco: Never  Vaping Use   Vaping Use: Never used  Substance Use Topics   Alcohol use: Yes    Comment: occ   Drug use: No     Family Hx: The patient's family history includes Arthritis in her brother; Diabetes in her mother; Heart disease in  her father and mother; Hyperlipidemia in her mother; Hypertension in her father and mother; Kidney disease in her father; Thyroid disease in her father.  ROS:   Please see the history of present illness.    Review of Systems  Constitutional:  Negative for chills, diaphoresis and fever.  HENT:  Negative for congestion, sinus pain and sore throat.   Respiratory:  Negative for cough.   Cardiovascular:  Negative for chest pain.  Skin:  Positive for rash.  Neurological:  Negative for dizziness and headaches.   All other systems reviewed and are negative.   Labs/Other Tests and Data Reviewed:    Recent Labs: 08/23/2020: ALT 15; Hemoglobin 14.7; Platelets 342; TSH 1.540 02/24/2021: BUN 8; Creatinine, Ser 0.79; Potassium 4.5; Sodium 139   Recent Lipid Panel Lab Results  Component Value Date/Time   CHOL 228 (H) 08/23/2020 09:38 AM   TRIG 98 08/23/2020 09:38 AM   HDL 63 08/23/2020 09:38 AM   CHOLHDL 3.6 08/23/2020 09:38 AM   LDLCALC 148 (H) 08/23/2020 09:38 AM    Wt Readings from Last 3 Encounters:  04/19/21 203 lb (92.1 kg)  02/28/21 208 lb 15.9 oz (94.8 kg)  09/07/20 202 lb 9.6 oz (91.9 kg)     Exam:    Vital Signs:  BP 110/77   Pulse 88   Wt 203 lb (92.1 kg)   LMP 01/05/2012   BMI 35.96 kg/m     Physical Exam Vitals and nursing note reviewed.  HENT:     Head: Normocephalic and atraumatic.  Pulmonary:     Effort: Pulmonary effort is normal.  Neurological:     Mental Status: She is alert and oriented to person, place, and time.  Psychiatric:        Mood and Affect: Affect normal.    ASSESSMENT & PLAN:    1. Impetigo -pt. Instructed if her rash gets worse to go to the urgent care or emergency room -Will change current antx to bactrim DS -Instructed patient she would need culture of rash if it does not get better.  - sulfamethoxazole-trimethoprim (BACTRIM DS) 800-160 MG tablet; Take 1 tablet by mouth 2 (two) times daily for 7 days.  Dispense: 14 tablet; Refill: 0 -  mupirocin ointment (BACTROBAN) 2 %; Apply 1 application topically 3 (three) times daily.  Dispense: 22 g; Refill: 0 -Pt. Verbalized understanding of the tx plan.   The patient was encouraged to call or send a message through [facility] for any questions or concerns.   Follow up: if symptoms persist or do not get better.   Side effects and appropriate use of all the medication(s) were discussed with the patient today. Patient advised to use the medication(s) as directed by their healthcare provider. The patient was encouraged to read, review, and understand all associated package inserts and contact our office with any questions or concerns. The patient accepts the risks of the treatment plan and had an opportunity to ask questions.   "I discussed  the limitations of evaluation and management by telemedicine and the availability of in person appointments. The patient expressed understanding and agreed to proceed"   Patient was given opportunity to ask questions. Patient verbalized understanding of the plan and was able to repeat key elements of the plan. All questions were answered to their satisfaction.  Raman Tonio Seider, DNP   I, Raman Channie Bostick have reviewed all documentation for this visit. The documentation on 09/30/20 for the exam, diagnosis, procedures, and orders are all accurate and complete.   COVID-19 Education: The signs and symptoms of COVID-19 were discussed with the patient and how to seek care for testing (follow up with PCP or arrange E-visit).  The importance of social distancing was discussed today.  Patient Risk:   After full review of this patients clinical status, I feel that they are at least moderate risk at this time.  Time:   Today, I have spent 10 minutes/ seconds with the patient with telehealth technology discussing above diagnoses.     Medication Adjustments/Labs and Tests Ordered: Current medicines are reviewed at length with the patient today.  Concerns regarding  medicines are outlined above.   Tests Ordered: No orders of the defined types were placed in this encounter.   Medication Changes: Meds ordered this encounter  Medications   sulfamethoxazole-trimethoprim (BACTRIM DS) 800-160 MG tablet    Sig: Take 1 tablet by mouth 2 (two) times daily for 7 days.    Dispense:  14 tablet    Refill:  0   mupirocin ointment (BACTROBAN) 2 %    Sig: Apply 1 application topically 3 (three) times daily.    Dispense:  22 g    Refill:  0    Disposition:  Follow up prn  Signed, Bary Castilla, NP

## 2021-04-22 DIAGNOSIS — L03211 Cellulitis of face: Secondary | ICD-10-CM | POA: Diagnosis not present

## 2021-04-24 ENCOUNTER — Emergency Department (HOSPITAL_BASED_OUTPATIENT_CLINIC_OR_DEPARTMENT_OTHER)
Admission: EM | Admit: 2021-04-24 | Discharge: 2021-04-24 | Disposition: A | Discharge status: Home / Self Care | Payer: 59 | Attending: Emergency Medicine | Admitting: Emergency Medicine

## 2021-04-24 ENCOUNTER — Other Ambulatory Visit: Payer: Self-pay

## 2021-04-24 ENCOUNTER — Encounter (HOSPITAL_BASED_OUTPATIENT_CLINIC_OR_DEPARTMENT_OTHER): Payer: Self-pay | Admitting: Emergency Medicine

## 2021-04-24 DIAGNOSIS — Z87891 Personal history of nicotine dependence: Secondary | ICD-10-CM | POA: Diagnosis not present

## 2021-04-24 DIAGNOSIS — I10 Essential (primary) hypertension: Secondary | ICD-10-CM | POA: Diagnosis not present

## 2021-04-24 DIAGNOSIS — Z79899 Other long term (current) drug therapy: Secondary | ICD-10-CM | POA: Insufficient documentation

## 2021-04-24 DIAGNOSIS — R21 Rash and other nonspecific skin eruption: Secondary | ICD-10-CM | POA: Diagnosis not present

## 2021-04-24 DIAGNOSIS — B029 Zoster without complications: Secondary | ICD-10-CM | POA: Diagnosis not present

## 2021-04-24 MED ORDER — VALACYCLOVIR HCL 500 MG PO TABS
1000.0000 mg | ORAL_TABLET | Freq: Once | ORAL | Status: AC
  Administered 2021-04-24: 03:00:00 1000 mg via ORAL
  Filled 2021-04-24: qty 2

## 2021-04-24 MED ORDER — PREDNISONE 20 MG PO TABS
20.0000 mg | ORAL_TABLET | Freq: Once | ORAL | Status: AC
  Administered 2021-04-24: 03:00:00 20 mg via ORAL
  Filled 2021-04-24: qty 1

## 2021-04-24 MED ORDER — PREDNISONE 10 MG PO TABS: 20.0000 mg | ORAL_TABLET | Freq: Two times a day (BID) | ORAL | 0 refills | Status: DC

## 2021-04-24 MED ORDER — VALACYCLOVIR HCL 1 G PO TABS: 1000.0000 mg | ORAL_TABLET | Freq: Three times a day (TID) | ORAL | 0 refills | Status: DC

## 2021-04-24 NOTE — ED Provider Notes (Signed)
[facility] EMERGENCY DEPARTMENT Provider Note   CSN: [id number] Arrival date & time: 04/24/21  0109     History Chief Complaint  Patient presents with   Rash    Danielle Silva is a 60 y.o. female.  Patient is a 60 year old female with past medical history of hypertension, GERD.  She presents today with complaints of facial rash.  Approximately 6 weeks ago she was treated for what was felt to be an infection at the site of her nose ring.  This was felt to be impetigo and was treated with Keflex.  Things seem to improve, then the rash recurred after she finished the antibiotics.  She developed blisters at the area which crusted and drained yellow fluid.  She was then started on Bactrim by her primary doctor which did not seem to help, then was changed to clindamycin which also does not seem to be helping.  Patient describes a burning, itchy, searing rash to her left cheek.  The history is provided by the patient.      Past Medical History:  Diagnosis Date   Anxiety    Complication of anesthesia    Depression    Diverticulitis    GERD (gastroesophageal reflux disease)    Hypertension    PONV (postoperative nausea and vomiting)     Patient Active Problem List   Diagnosis Date Noted   Hyperlipidemia 01/04/2018   Multinodular goiter (nontoxic) 05/29/2013    Past Surgical History:  Procedure Laterality Date   ANKLE FRACTURE SURGERY Left 2011   Left ankle   [place] ARTHROSCOPY WITH SUBACROMIAL DECOMPRESSION Right 02/28/2021   Procedure: SHOULDER ARTHROSCOPY ROTATOR CUFF DEBRIDEMENT, SUBACROMIAL DECOMPRESSION;  Surgeon: Tania Ade, MD;  Location: [facility];  Service: Orthopedics;  Laterality: Right;   TONSILECTOMY/ADENOIDECTOMY WITH MYRINGOTOMY Bilateral [place]     OB History   No obstetric history on file.     Family History  Problem Relation Age of Onset    Diabetes Mother    Hypertension Mother    Hyperlipidemia Mother    Heart disease Mother        CABG   Hypertension Father    Kidney disease Father        kidney transplant   Thyroid disease Father    Heart disease Father        A-fib   Arthritis Brother     Social History   Tobacco Use   Smoking status: Former    Years: 15.00    Types: Cigarettes   Smokeless tobacco: Never  Vaping Use   Vaping Use: Never used  Substance Use Topics   Alcohol use: Yes    Comment: occ   Drug use: No    Home Medications Prior to Admission medications   Medication Sig Start Date End Date Taking? Authorizing Provider  azelastine (ASTELIN) 0.1 % nasal spray SPRAY 2 SPRAYS IN EACH NOSTRIL AS DIRECTED 08/23/20   Glendale Chard, MD  buPROPion (WELLBUTRIN XL) 150 MG 24 hr tablet Take 1 tablet (150 mg total) by mouth daily. 08/23/20   Glendale Chard, MD  calcium carbonate (OS-CAL) 600 MG TABS tablet Take 600 mg by mouth daily.     [provider]  cetirizine (ZYRTEC) 5 MG tablet Take 5 mg by mouth daily.    [provider]  Cholecalciferol (VITAMIN D3) 5000 units CAPS Take by mouth.    [provider]  mupirocin ointment (BACTROBAN) 2 % Apply 1 application topically 3 (three) times daily. 04/19/21   Ghumman, Milford Cage, NP  sulfamethoxazole-trimethoprim (BACTRIM DS) 800-160 MG tablet Take 1 tablet by mouth 2 (two) times daily for 7 days. 04/19/21 04/26/21  Bary Castilla, NP  valsartan-hydrochlorothiazide (DIOVAN HCT) 80-12.5 MG tablet Take 1 tablet by mouth daily. 09/07/20 09/07/21  Bary Castilla, NP  zinc gluconate 50 MG tablet Take 50 mg by mouth daily.    [provider]    Allergies    Macrobid [nitrofurantoin monohydrate macrocrystals], Nsaids, and Percocet [oxycodone-acetaminophen]  Review of Systems   Review of Systems  All other systems reviewed and are negative.  Physical Exam Updated Vital Signs BP (!) 146/106 (BP Location: Left Arm)   Pulse 92    Temp 98 F (36.7 C) (Oral)   Resp 16   LMP 01/05/2012   SpO2 100%   Physical Exam Vitals and nursing note reviewed.  Constitutional:      General: She is not in acute distress.    Appearance: Normal appearance. She is not ill-appearing.  HENT:     Head: Normocephalic and atraumatic.  Pulmonary:     Effort: Pulmonary effort is normal.     Breath sounds: Normal breath sounds.  Skin:    General: Skin is warm and dry.     Comments: There is an erythematous, raised, crusty rash noted to the left cheek and left side of the nose.  Neurological:     Mental Status: She is alert.    ED Results / Procedures / Treatments   Labs (all labs ordered are listed, but only abnormal results are displayed) Labs Reviewed - No data to display  EKG None  Radiology No results found.  Procedures Procedures   Medications Ordered in ED Medications  valACYclovir (VALTREX) tablet 1,000 mg (has no administration in time range)  predniSONE (DELTASONE) tablet 20 mg (has no administration in time range)    ED Course  I have reviewed the triage vital signs and the nursing notes.  Pertinent labs & imaging results that were available during my care of the patient were reviewed by me and considered in my medical decision making (see chart for details).    MDM Rules/Calculators/A&P  Patient now on her third antibiotic for facial rash that was felt to be impetigo/cellulitis.  She is currently taking clindamycin and the rash is becoming more painful and red.  To my exam, I am concerned about the possibility of shingles.  The rash involves the left side of the nose and left cheek, but does not cross the midline.  My plan is to add Valtrex and prednisone and have patient follow-up later this week with her primary doctor  Final Clinical Impression(s) / ED Diagnoses Final diagnoses:  None    Rx / DC Orders ED Discharge Orders     None        Veryl Speak, MD 04/24/21 [phone]

## 2021-04-24 NOTE — Discharge Instructions (Addendum)
Begin taking Valtrex and prednisone as prescribed.  Continue taking clindamycin as previously prescribed.  Follow-up with your primary doctor later this week for a recheck, and return to the ER if symptoms worsen or change.

## 2021-04-24 NOTE — ED Triage Notes (Signed)
Rash on left side of face. Pt reports worsening redness, blisters, and swelling. Dx with impetigo and rx'd abx with no resolution.

## 2021-04-24 NOTE — ED Notes (Signed)
Discharge instructions discussed with pt. Pt verbalized understanding with no questions at this time.  

## 2021-05-01 ENCOUNTER — Other Ambulatory Visit: Payer: Self-pay | Admitting: Internal Medicine

## 2021-05-10 ENCOUNTER — Encounter: Payer: Self-pay | Admitting: Internal Medicine

## 2021-05-10 DIAGNOSIS — Z20822 Contact with and (suspected) exposure to covid-19: Secondary | ICD-10-CM | POA: Diagnosis not present

## 2021-05-10 DIAGNOSIS — J069 Acute upper respiratory infection, unspecified: Secondary | ICD-10-CM | POA: Diagnosis not present

## 2021-06-30 DIAGNOSIS — R194 Change in bowel habit: Secondary | ICD-10-CM | POA: Diagnosis not present

## 2021-06-30 DIAGNOSIS — K52832 Lymphocytic colitis: Secondary | ICD-10-CM | POA: Diagnosis not present

## 2021-06-30 DIAGNOSIS — K5730 Diverticulosis of large intestine without perforation or abscess without bleeding: Secondary | ICD-10-CM | POA: Diagnosis not present

## 2021-06-30 DIAGNOSIS — J45998 Other asthma: Secondary | ICD-10-CM | POA: Diagnosis not present

## 2021-06-30 DIAGNOSIS — I10 Essential (primary) hypertension: Secondary | ICD-10-CM | POA: Diagnosis not present

## 2021-07-24 ENCOUNTER — Other Ambulatory Visit: Payer: Self-pay | Admitting: Internal Medicine

## 2021-07-27 ENCOUNTER — Other Ambulatory Visit: Payer: Self-pay | Admitting: Internal Medicine

## 2021-08-29 ENCOUNTER — Encounter: Payer: Self-pay | Admitting: Internal Medicine

## 2021-08-29 ENCOUNTER — Ambulatory Visit (INDEPENDENT_AMBULATORY_CARE_PROVIDER_SITE_OTHER): Payer: 59 | Admitting: Internal Medicine

## 2021-08-29 VITALS — BP 132/84 | HR 64 | Temp 98.3°F | Ht 62.6 in | Wt 205.4 lb

## 2021-08-29 DIAGNOSIS — Z8619 Personal history of other infectious and parasitic diseases: Secondary | ICD-10-CM

## 2021-08-29 DIAGNOSIS — G9331 Postviral fatigue syndrome: Secondary | ICD-10-CM

## 2021-08-29 DIAGNOSIS — G9339 Other post infection and related fatigue syndromes: Secondary | ICD-10-CM | POA: Diagnosis not present

## 2021-08-29 DIAGNOSIS — Z6836 Body mass index (BMI) 36.0-36.9, adult: Secondary | ICD-10-CM | POA: Diagnosis not present

## 2021-08-29 DIAGNOSIS — I10 Essential (primary) hypertension: Secondary | ICD-10-CM

## 2021-08-29 DIAGNOSIS — E538 Deficiency of other specified B group vitamins: Secondary | ICD-10-CM

## 2021-08-29 DIAGNOSIS — Z0000 Encounter for general adult medical examination without abnormal findings: Secondary | ICD-10-CM | POA: Diagnosis not present

## 2021-08-29 LAB — POCT URINALYSIS DIPSTICK
Bilirubin, UA: NEGATIVE
Blood, UA: NEGATIVE
Glucose, UA: NEGATIVE
Ketones, UA: NEGATIVE
Leukocytes, UA: NEGATIVE
Nitrite, UA: NEGATIVE
Protein, UA: NEGATIVE
Spec Grav, UA: 1.02 (ref 1.010–1.025)
Urobilinogen, UA: 0.2 E.U./dL
pH, UA: 7 (ref 5.0–8.0)

## 2021-08-29 MED ORDER — CYANOCOBALAMIN 1000 MCG/ML IJ SOLN
1000.0000 ug | Freq: Once | INTRAMUSCULAR | Status: AC
  Administered 2021-08-29: 1000 ug via INTRAMUSCULAR

## 2021-08-29 MED ORDER — AZELASTINE HCL 0.1 % NA SOLN: NASAL | 2 refills | Status: DC

## 2021-08-29 MED ORDER — VALSARTAN-HYDROCHLOROTHIAZIDE 80-12.5 MG PO TABS: 1.0000 | ORAL_TABLET | Freq: Every day | ORAL | 3 refills | Status: DC

## 2021-08-29 MED ORDER — BUPROPION HCL ER (XL) 300 MG PO TB24: 300.0000 mg | ORAL_TABLET | Freq: Every day | ORAL | 2 refills | Status: DC

## 2021-08-29 NOTE — Patient Instructions (Signed)

## 2021-08-29 NOTE — Progress Notes (Signed)
?[employer] as a Education administrator for [name], MD.,have documented all relevant documentation on the behalf of [name], MD,as directed by  [name], MD while in the presence of [name], MD.  ?This visit occurred during the SARS-CoV-2 public health emergency.  Safety protocols were in place, including screening questions prior to the visit, additional usage of staff PPE, and extensive cleaning of exam room while observing appropriate contact time as indicated for disinfecting solutions. ? ?Subjective:  ?  ? Patient ID: Danielle Silva , female    DOB: May 17, 1961 , 61 y.o.   MRN: [id number] ? ? ?Chief Complaint  ?Patient presents with  ? Annual Exam  ? ? ?HPI ? ?She is here today for a full physical examination. She is followed by Dr. Garwin Brothers for her GYN exams.  Last exam was 2022, next appointment for pap and mammogram is 09/15/2021.  The patient states she had shingles December 2022. ? ?Hypertension ?This is a chronic problem. The current episode started more than 1 year ago. The problem has been gradually improving since onset. The problem is controlled. Pertinent negatives include no blurred vision, chest pain or shortness of breath. Risk factors for coronary artery disease include sedentary lifestyle and post-menopausal state. The current treatment provides moderate improvement. There is no history of kidney disease or CAD/MI.   ? ?Past Medical History:  ?Diagnosis Date  ? Anxiety   ? Complication of anesthesia   ? Depression   ? Diverticulitis   ? GERD (gastroesophageal reflux disease)   ? Hypertension   ? PONV (postoperative nausea and vomiting)   ?  ? ?Family History  ?Problem Relation Age of Onset  ? Diabetes Mother   ? Hypertension Mother   ? Hyperlipidemia Mother   ? Heart disease Mother   ?     CABG  ? Hypertension Father   ? Kidney disease Father   ?     kidney transplant  ? Thyroid disease Father   ? Heart disease Father   ?     A-fib  ? Arthritis Brother    ? ? ? ?Current Outpatient Medications:  ?  buPROPion (WELLBUTRIN XL) 300 MG 24 hr tablet, Take 1 tablet (300 mg total) by mouth daily., Disp: 90 tablet, Rfl: 2 ?  calcium carbonate (OS-CAL) 600 MG TABS tablet, Take 600 mg by mouth daily. , Disp: , Rfl:  ?  cetirizine (ZYRTEC) 5 MG tablet, Take 5 mg by mouth daily., Disp: , Rfl:  ?  Cholecalciferol (VITAMIN D3) 5000 units CAPS, Take by mouth., Disp: , Rfl:  ?  zinc gluconate 50 MG tablet, Take 50 mg by mouth daily., Disp: , Rfl:  ?  azelastine (ASTELIN) 0.1 % nasal spray, Use in each nostril as directed, Disp: 90 mL, Rfl: 2 ?  valACYclovir (VALTREX) 1000 MG tablet, Take 1 tablet (1,000 mg total) by mouth 3 (three) times daily. (Patient not taking: Reported on 08/29/2021), Disp: 21 tablet, Rfl: 0 ?  valsartan-hydrochlorothiazide (DIOVAN HCT) 80-12.5 MG tablet, Take 1 tablet by mouth daily., Disp: 90 tablet, Rfl: 3 ? ?Current Facility-Administered Medications:  ?  cyanocobalamin ((VITAMIN B-12)) injection 1,000 mcg, 1,000 mcg, Intramuscular, Once, Glendale Chard, MD  ? ?Allergies  ?Allergen Reactions  ? Macrobid [Nitrofurantoin Monohydrate Macrocrystals] Hives  ? Nsaids Other (See Comments)  ?  Avoids bc of colitis ?  ? Percocet [Oxycodone-Acetaminophen] Itching and Nausea And Vomiting  ?  Can take with benadryl  ?  ? ? ?The  patient states she uses post menopausal status for birth control. Last LMP was Patient's last menstrual period was 01/05/2012.. Negative for Dysmenorrhea. Negative for: breast discharge, breast lump(s), breast pain and breast self exam. Associated symptoms include abnormal vaginal bleeding. Pertinent negatives include abnormal bleeding (hematology), anxiety, decreased libido, depression, difficulty falling sleep, dyspareunia, history of infertility, nocturia, sexual dysfunction, sleep disturbances, urinary incontinence, urinary urgency, vaginal discharge and vaginal itching. Diet regular.The patient states her exercise level is  intermittent. ? .  The patient's tobacco use is:  ?Social History  ? ?Tobacco Use  ?Smoking Status Former  ? Years: 15.00  ? Types: Cigarettes  ?Smokeless Tobacco Never  ?Marland Kitchen She has been exposed to passive smoke. The patient's alcohol use is:  ?Social History  ? ?Substance and Sexual Activity  ?Alcohol Use Yes  ? Comment: occ  ? ?Review of Systems  ?Constitutional:  Positive for fatigue.  ?     She c/o fatigue. States she has been quite fatigued since having shingles back in Dec. She does not wish to get shingles vaccine at this time.   ?HENT: Negative.    ?Eyes: Negative.  Negative for blurred vision.  ?Respiratory: Negative.  Negative for shortness of breath.   ?Cardiovascular: Negative.  Negative for chest pain.  ?Endocrine: Negative.   ?Genitourinary: Negative.   ?Musculoskeletal: Negative.   ?Skin: Negative.   ?Allergic/Immunologic: Negative.   ?Neurological: Negative.   ?Hematological: Negative.   ?Psychiatric/Behavioral: Negative.     ? ?Today's Vitals  ? 08/29/21 0849  ?BP: 132/84  ?Pulse: 64  ?Temp: 98.3 ?F (36.8 ?C)  ?Weight: 205 lb 6.4 oz (93.2 kg)  ?Height: 5' 2.6" (1.59 m)  ? ?Body mass index is 36.85 kg/m?.  ?Wt Readings from Last 3 Encounters:  ?08/29/21 205 lb 6.4 oz (93.2 kg)  ?04/19/21 203 lb (92.1 kg)  ?02/28/21 208 lb 15.9 oz (94.8 kg)  ?  ?BP Readings from Last 3 Encounters:  ?08/29/21 132/84  ?04/24/21 127/89  ?04/19/21 110/77  ?  ?Objective:  ?Physical Exam ?Vitals and nursing note reviewed.  ?Constitutional:   ?   Appearance: Normal appearance. She is obese.  ?HENT:  ?   Head: Normocephalic and atraumatic.  ?   Right Ear: Tympanic membrane, ear canal and external ear normal.  ?   Left Ear: Tympanic membrane, ear canal and external ear normal.  ?   Mouth/Throat:  ?   Mouth: Mucous membranes are dry.  ?   Pharynx: Oropharynx is clear.  ?Eyes:  ?   Extraocular Movements: Extraocular movements intact.  ?   Conjunctiva/sclera: Conjunctivae normal.  ?   Pupils: Pupils are equal, round, and reactive to light.   ?Cardiovascular:  ?   Rate and Rhythm: Normal rate and regular rhythm.  ?   Pulses: Normal pulses.  ?   Heart sounds: Normal heart sounds.  ?Pulmonary:  ?   Effort: Pulmonary effort is normal.  ?   Breath sounds: Normal breath sounds.  ?Chest:  ?Breasts: ?   Tanner Score is 5.  ?   Right: Normal.  ?   Left: Normal.  ?Abdominal:  ?   General: Abdomen is flat. Bowel sounds are normal.  ?   Palpations: Abdomen is soft.  ?Genitourinary: ?   Comments: deferred ?Musculoskeletal:     ?   General: Normal range of motion.  ?   Cervical back: Normal range of motion and neck supple.  ?Skin: ?   General: Skin is warm and dry.  ?  Comments: Tattoos on lower back, anterior chest  ?Neurological:  ?   General: No focal deficit present.  ?   Mental Status: She is alert and oriented to person, place, and time.  ?Psychiatric:     ?   Mood and Affect: Mood normal.     ?   Behavior: Behavior normal.  ?   ?Assessment And Plan:  ?   ?1. Routine general medical examination at health care facility ?Comments: A full exam was performed. Importance of monthly self breast exams was discussed with the patient. PATIENT IS ADVISED TO GET 30-45 MINUTES REGULAR EXERCISE NO LESS THAN FOUR TO FIVE DAYS PER WEEK - BOTH WEIGHTBEARING EXERCISES AND AEROBIC ARE RECOMMENDED.  PATIENT IS ADVISED TO FOLLOW A HEALTHY DIET WITH AT LEAST SIX FRUITS/VEGGIES PER DAY, DECREASE INTAKE OF RED MEAT, AND TO INCREASE FISH INTAKE TO TWO DAYS PER WEEK.  MEATS/FISH SHOULD NOT BE FRIED, BAKED OR BROILED IS PREFERABLE.  IT IS ALSO IMPORTANT TO CUT BACK ON YOUR SUGAR INTAKE. PLEASE AVOID ANYTHING WITH ADDED SUGAR, CORN SYRUP OR OTHER SWEETENERS. IF YOU MUST USE A SWEETENER, YOU CAN TRY STEVIA. IT IS ALSO IMPORTANT TO AVOID ARTIFICIALLY SWEETENERS AND DIET BEVERAGES. LASTLY, I SUGGEST WEARING SPF 50 SUNSCREEN ON EXPOSED PARTS AND ESPECIALLY WHEN IN THE DIRECT SUNLIGHT FOR AN EXTENDED PERIOD OF TIME.  PLEASE AVOID FAST FOOD RESTAURANTS AND INCREASE YOUR WATER INTAKE. ?-  CBC ?- CMP14+EGFR ?- Lipid panel ?- Insulin, random(561) ? ?2. Essential hypertension, benign ?Comments: Chronic, fair control. Goal BP<130/80.  EKG, NSR w/o changes. Advised to follow low sodium diet. She will c/w valsartan/hc

## 2021-08-31 LAB — CMP14+EGFR
ALT: 10 IU/L (ref 0–32)
AST: 12 IU/L (ref 0–40)
Albumin/Globulin Ratio: 2 (ref 1.2–2.2)
Albumin: 4.3 g/dL (ref 3.8–4.9)
Alkaline Phosphatase: 73 IU/L (ref 44–121)
BUN/Creatinine Ratio: 9 (ref 9–23)
BUN: 7 mg/dL (ref 6–24)
Bilirubin Total: 0.4 mg/dL (ref 0.0–1.2)
CO2: 29 mmol/L (ref 20–29)
Calcium: 9.4 mg/dL (ref 8.7–10.2)
Chloride: 104 mmol/L (ref 96–106)
Creatinine, Ser: 0.76 mg/dL (ref 0.57–1.00)
Globulin, Total: 2.1 g/dL (ref 1.5–4.5)
Glucose: 95 mg/dL (ref 70–99)
Potassium: 4.1 mmol/L (ref 3.5–5.2)
Sodium: 142 mmol/L (ref 134–144)
Total Protein: 6.4 g/dL (ref 6.0–8.5)
eGFR: 91 mL/min/{1.73_m2} (ref >59)

## 2021-08-31 LAB — LIPID PANEL
Chol/HDL Ratio: 3.3 ratio (ref 0.0–4.4)
Cholesterol, Total: 224 mg/dL — ABNORMAL HIGH (ref 100–199)
HDL: 68 mg/dL (ref >39)
LDL Chol Calc (NIH): 136 mg/dL — ABNORMAL HIGH (ref 0–99)
Triglycerides: 115 mg/dL (ref 0–149)
VLDL Cholesterol Cal: 20 mg/dL (ref 5–40)

## 2021-08-31 LAB — CBC
Hematocrit: 41.9 % (ref 34.0–46.6)
Hemoglobin: 14 g/dL (ref 11.1–15.9)
MCH: 30.6 pg (ref 26.6–33.0)
MCHC: 33.4 g/dL (ref 31.5–35.7)
MCV: 92 fL (ref 79–97)
Platelets: 351 10*3/uL (ref 150–450)
RBC: 4.58 x10E6/uL (ref 3.77–5.28)
RDW: 12 % (ref 11.7–15.4)
WBC: 7 10*3/uL (ref 3.4–10.8)

## 2021-08-31 LAB — TSH+FREE T4
Free T4: 0.98 ng/dL (ref 0.82–1.77)
TSH: 1.84 u[IU]/mL (ref 0.450–4.500)

## 2021-08-31 LAB — INSULIN, RANDOM: INSULIN: 11 u[IU]/mL (ref 2.6–24.9)

## 2021-08-31 LAB — MICROALBUMIN / CREATININE URINE RATIO
Creatinine, Urine: 101.6 mg/dL
Microalb/Creat Ratio: 13 mg/g creat (ref 0–29)
Microalbumin, Urine: 13 ug/mL

## 2021-08-31 LAB — VITAMIN B12: Vitamin B-12: 257 pg/mL (ref 232–1245)

## 2021-09-01 ENCOUNTER — Encounter: Payer: Self-pay | Admitting: Internal Medicine

## 2021-09-04 ENCOUNTER — Other Ambulatory Visit: Payer: Self-pay | Admitting: Internal Medicine

## 2021-09-06 ENCOUNTER — Ambulatory Visit (INDEPENDENT_AMBULATORY_CARE_PROVIDER_SITE_OTHER): Payer: 59

## 2021-09-06 VITALS — BP 130/80 | HR 75 | Temp 98.0°F | Ht 62.6 in | Wt 205.0 lb

## 2021-09-06 DIAGNOSIS — E538 Deficiency of other specified B group vitamins: Secondary | ICD-10-CM | POA: Diagnosis not present

## 2021-09-06 MED ORDER — RYBELSUS 3 MG PO TABS: 3.0000 mg | ORAL_TABLET | Freq: Every day | ORAL | 1 refills | Status: DC

## 2021-09-06 MED ORDER — CYANOCOBALAMIN 1000 MCG/ML IJ SOLN
1000.0000 ug | Freq: Once | INTRAMUSCULAR | Status: AC
  Administered 2021-09-06: 1000 ug via INTRAMUSCULAR

## 2021-09-06 NOTE — Progress Notes (Signed)
Patient presents for 1st b12 injection.  ?

## 2021-09-07 ENCOUNTER — Encounter: Payer: Self-pay | Admitting: Internal Medicine

## 2021-09-13 ENCOUNTER — Ambulatory Visit (INDEPENDENT_AMBULATORY_CARE_PROVIDER_SITE_OTHER): Payer: 59

## 2021-09-13 ENCOUNTER — Telehealth: Payer: Self-pay

## 2021-09-13 VITALS — BP 122/68 | HR 68 | Temp 98.1°F | Ht 62.6 in | Wt 202.0 lb

## 2021-09-13 DIAGNOSIS — E538 Deficiency of other specified B group vitamins: Secondary | ICD-10-CM | POA: Diagnosis not present

## 2021-09-13 MED ORDER — CYANOCOBALAMIN 1000 MCG/ML IJ SOLN
1000.0000 ug | Freq: Once | INTRAMUSCULAR | Status: AC
  Administered 2021-09-13: 1000 ug via INTRAMUSCULAR

## 2021-09-13 MED ORDER — WEGOVY 0.5 MG/0.5ML ~~LOC~~ SOAJ: 0.5000 mg | SUBCUTANEOUS | 0 refills | Status: DC

## 2021-09-13 NOTE — Progress Notes (Signed)
Patient presents for 2nd b12 injection.  ?

## 2021-09-13 NOTE — Telephone Encounter (Signed)
Pa for wegovy has been submitted through covermymeds and we are just waiting on the determination from her insurance. YL,RMA ?

## 2021-09-13 NOTE — Patient Instructions (Signed)
Vitamin B12 Deficiency Vitamin B12 deficiency means that your body does not have enough vitamin B12. The body needs this important vitamin: To make red blood cells. To make genes (DNA). To help the nerves work. If you do not have enough vitamin B12 in your body, you can have health problems, such as not having enough red blood cells in the blood (anemia). What are the causes? Not eating enough foods that contain vitamin B12. Not being able to take in (absorb) vitamin B12 from the food that you eat. Certain diseases. A condition in which the body does not make enough of a certain protein. This results in your body not taking in enough vitamin B12. Having a surgery in which part of the stomach or small intestine is taken out. Taking medicines that make it hard for the body to take in vitamin B12. These include: Heartburn medicines. Some medicines that are used to treat diabetes. What increases the risk? Being an older adult. Eating a vegetarian or vegan diet that does not include any foods that come from animals. Not eating enough foods that contain vitamin B12 while you are pregnant. Taking certain medicines. Having alcoholism. What are the signs or symptoms? In some cases, there are no symptoms. If the condition leads to too few blood cells or nerve damage, symptoms can occur, such as: Feeling weak or tired. Not being hungry. Losing feeling (numbness) or tingling in your hands and feet. Redness and burning of the tongue. Feeling sad (depressed). Confusion or memory problems. Trouble walking. If anemia is very bad, symptoms can include: Being short of breath. Being dizzy. Having a very fast heartbeat. How is this treated? Changing the way you eat and drink, such as: Eating more foods that contain vitamin B12. Drinking little or no alcohol. Getting vitamin B12 shots. Taking vitamin B12 supplements by mouth (orally). Your doctor will tell you the dose that is best for you. Follow  these instructions at home: Eating and drinking  Eat foods that come from animals and have a lot of vitamin B12 in them. These include: Meats and poultry. This includes beef, pork, chicken, turkey, and organ meats, such as liver. Seafood, such as clams, rainbow trout, salmon, tuna, and haddock. Eggs. Dairy foods such as milk, yogurt, and cheese. Eat breakfast cereals that have vitamin B12 added to them (are fortified). Check the label. The items listed above may not be a complete list of foods and beverages you can eat and drink. Contact a dietitian for more information. Alcohol use Do not drink alcohol if: Your doctor tells you not to drink. You are pregnant, may be pregnant, or are planning to become pregnant. If you drink alcohol: Limit how much you have to: 0-1 drink a day for women. 0-2 drinks a day for men. Know how much alcohol is in your drink. In the [place] one drink equals one 12 oz bottle of beer (355 mL), one 5 oz glass of wine (148 mL), or one 1 oz glass of hard liquor (44 mL). General instructions Get any vitamin B12 shots if told by your doctor. Take supplements only as told by your doctor. Follow the directions. Keep all follow-up visits. Contact a doctor if: Your symptoms come back. Your symptoms get worse or do not get better with treatment. Get help right away if: You have trouble breathing. You have a very fast heartbeat. You have chest pain. You get dizzy. You faint. These symptoms may be an emergency. Get help right away. Call 911.   Do not wait to see if the symptoms will go away. Do not drive yourself to the hospital. Summary Vitamin B12 deficiency means that your body is not getting enough of the vitamin. In some cases, there are no symptoms of this condition. Treatment may include making a change in the way you eat and drink, getting shots, or taking supplements. Eat foods that have vitamin B12 in them. This information is not intended to replace advice  given to you by your health care provider. Make sure you discuss any questions you have with your health care provider. Document Revised: 12/31/2020 Document Reviewed: 12/31/2020 Elsevier Patient Education  2023 [employer] Inc[employer]  

## 2021-09-15 DIAGNOSIS — Z780 Asymptomatic menopausal state: Secondary | ICD-10-CM | POA: Diagnosis not present

## 2021-09-15 DIAGNOSIS — N952 Postmenopausal atrophic vaginitis: Secondary | ICD-10-CM | POA: Diagnosis not present

## 2021-09-15 DIAGNOSIS — Z01419 Encounter for gynecological examination (general) (routine) without abnormal findings: Secondary | ICD-10-CM | POA: Diagnosis not present

## 2021-09-15 DIAGNOSIS — M8580 Other specified disorders of bone density and structure, unspecified site: Secondary | ICD-10-CM | POA: Diagnosis not present

## 2021-09-15 DIAGNOSIS — I10 Essential (primary) hypertension: Secondary | ICD-10-CM | POA: Diagnosis not present

## 2021-09-15 DIAGNOSIS — Z1231 Encounter for screening mammogram for malignant neoplasm of breast: Secondary | ICD-10-CM | POA: Diagnosis not present

## 2021-09-20 ENCOUNTER — Ambulatory Visit (INDEPENDENT_AMBULATORY_CARE_PROVIDER_SITE_OTHER): Payer: 59

## 2021-09-20 VITALS — BP 110/68 | HR 72 | Temp 98.1°F | Ht 62.0 in | Wt 200.0 lb

## 2021-09-20 DIAGNOSIS — Z23 Encounter for immunization: Secondary | ICD-10-CM

## 2021-09-20 DIAGNOSIS — E538 Deficiency of other specified B group vitamins: Secondary | ICD-10-CM

## 2021-09-20 MED ORDER — CYANOCOBALAMIN 1000 MCG/ML IJ SOLN
1000.0000 ug | Freq: Once | INTRAMUSCULAR | Status: AC
  Administered 2021-09-20: 1000 ug via INTRAMUSCULAR

## 2021-09-20 NOTE — Progress Notes (Signed)
Pt presents for 3rd b12.  ?

## 2021-09-24 DIAGNOSIS — H1032 Unspecified acute conjunctivitis, left eye: Secondary | ICD-10-CM | POA: Diagnosis not present

## 2021-09-27 ENCOUNTER — Ambulatory Visit (INDEPENDENT_AMBULATORY_CARE_PROVIDER_SITE_OTHER): Payer: 59

## 2021-09-27 VITALS — BP 128/68 | HR 77 | Temp 98.3°F

## 2021-09-27 DIAGNOSIS — E538 Deficiency of other specified B group vitamins: Secondary | ICD-10-CM | POA: Diagnosis not present

## 2021-09-27 MED ORDER — CYANOCOBALAMIN 1000 MCG/ML IJ SOLN
1000.0000 ug | Freq: Once | INTRAMUSCULAR | Status: AC
  Administered 2021-09-27: 1000 ug via INTRAMUSCULAR

## 2021-09-27 NOTE — Patient Instructions (Signed)
Vitamin B12 Deficiency Vitamin B12 deficiency means that your body does not have enough vitamin B12. The body needs this important vitamin: To make red blood cells. To make genes (DNA). To help the nerves work. If you do not have enough vitamin B12 in your body, you can have health problems, such as not having enough red blood cells in the blood (anemia). What are the causes? Not eating enough foods that contain vitamin B12. Not being able to take in (absorb) vitamin B12 from the food that you eat. Certain diseases. A condition in which the body does not make enough of a certain protein. This results in your body not taking in enough vitamin B12. Having a surgery in which part of the stomach or small intestine is taken out. Taking medicines that make it hard for the body to take in vitamin B12. These include: Heartburn medicines. Some medicines that are used to treat diabetes. What increases the risk? Being an older adult. Eating a vegetarian or vegan diet that does not include any foods that come from animals. Not eating enough foods that contain vitamin B12 while you are pregnant. Taking certain medicines. Having alcoholism. What are the signs or symptoms? In some cases, there are no symptoms. If the condition leads to too few blood cells or nerve damage, symptoms can occur, such as: Feeling weak or tired. Not being hungry. Losing feeling (numbness) or tingling in your hands and feet. Redness and burning of the tongue. Feeling sad (depressed). Confusion or memory problems. Trouble walking. If anemia is very bad, symptoms can include: Being short of breath. Being dizzy. Having a very fast heartbeat. How is this treated? Changing the way you eat and drink, such as: Eating more foods that contain vitamin B12. Drinking little or no alcohol. Getting vitamin B12 shots. Taking vitamin B12 supplements by mouth (orally). Your doctor will tell you the dose that is best for you. Follow  these instructions at home: Eating and drinking  Eat foods that come from animals and have a lot of vitamin B12 in them. These include: Meats and poultry. This includes beef, pork, chicken, turkey, and organ meats, such as liver. Seafood, such as clams, rainbow trout, salmon, tuna, and haddock. Eggs. Dairy foods such as milk, yogurt, and cheese. Eat breakfast cereals that have vitamin B12 added to them (are fortified). Check the label. The items listed above may not be a complete list of foods and beverages you can eat and drink. Contact a dietitian for more information. Alcohol use Do not drink alcohol if: Your doctor tells you not to drink. You are pregnant, may be pregnant, or are planning to become pregnant. If you drink alcohol: Limit how much you have to: 0-1 drink a day for women. 0-2 drinks a day for men. Know how much alcohol is in your drink. In the [place] one drink equals one 12 oz bottle of beer (355 mL), one 5 oz glass of wine (148 mL), or one 1 oz glass of hard liquor (44 mL). General instructions Get any vitamin B12 shots if told by your doctor. Take supplements only as told by your doctor. Follow the directions. Keep all follow-up visits. Contact a doctor if: Your symptoms come back. Your symptoms get worse or do not get better with treatment. Get help right away if: You have trouble breathing. You have a very fast heartbeat. You have chest pain. You get dizzy. You faint. These symptoms may be an emergency. Get help right away. Call 911.   Do not wait to see if the symptoms will go away. Do not drive yourself to the hospital. Summary Vitamin B12 deficiency means that your body is not getting enough of the vitamin. In some cases, there are no symptoms of this condition. Treatment may include making a change in the way you eat and drink, getting shots, or taking supplements. Eat foods that have vitamin B12 in them. This information is not intended to replace advice  given to you by your health care provider. Make sure you discuss any questions you have with your health care provider. Document Revised: 12/31/2020 Document Reviewed: 12/31/2020 Elsevier Patient Education  2023 [employer] Inc[employer]  

## 2021-09-27 NOTE — Progress Notes (Signed)
Patient presents today for 4th b12 injection. Her next injection will but after her follow up with provider.  ?

## 2021-10-10 ENCOUNTER — Encounter: Payer: Self-pay | Admitting: Internal Medicine

## 2021-10-10 ENCOUNTER — Ambulatory Visit (INDEPENDENT_AMBULATORY_CARE_PROVIDER_SITE_OTHER): Payer: 59 | Admitting: Internal Medicine

## 2021-10-10 VITALS — BP 116/80 | HR 64 | Temp 98.1°F | Ht 62.0 in | Wt 200.6 lb

## 2021-10-10 DIAGNOSIS — Z6836 Body mass index (BMI) 36.0-36.9, adult: Secondary | ICD-10-CM | POA: Diagnosis not present

## 2021-10-10 DIAGNOSIS — E538 Deficiency of other specified B group vitamins: Secondary | ICD-10-CM | POA: Diagnosis not present

## 2021-10-10 MED ORDER — CYANOCOBALAMIN 1000 MCG/ML IJ SOLN
1000.0000 ug | Freq: Once | INTRAMUSCULAR | Status: AC
  Administered 2021-10-10: 1000 ug via INTRAMUSCULAR

## 2021-10-10 NOTE — Patient Instructions (Signed)
Vitamin B12 Deficiency Vitamin B12 deficiency means that your body does not have enough vitamin B12. The body needs this important vitamin: To make red blood cells. To make genes (DNA). To help the nerves work. If you do not have enough vitamin B12 in your body, you can have health problems, such as not having enough red blood cells in the blood (anemia). What are the causes? Not eating enough foods that contain vitamin B12. Not being able to take in (absorb) vitamin B12 from the food that you eat. Certain diseases. A condition in which the body does not make enough of a certain protein. This results in your body not taking in enough vitamin B12. Having a surgery in which part of the stomach or small intestine is taken out. Taking medicines that make it hard for the body to take in vitamin B12. These include: Heartburn medicines. Some medicines that are used to treat diabetes. What increases the risk? Being an older adult. Eating a vegetarian or vegan diet that does not include any foods that come from animals. Not eating enough foods that contain vitamin B12 while you are pregnant. Taking certain medicines. Having alcoholism. What are the signs or symptoms? In some cases, there are no symptoms. If the condition leads to too few blood cells or nerve damage, symptoms can occur, such as: Feeling weak or tired. Not being hungry. Losing feeling (numbness) or tingling in your hands and feet. Redness and burning of the tongue. Feeling sad (depressed). Confusion or memory problems. Trouble walking. If anemia is very bad, symptoms can include: Being short of breath. Being dizzy. Having a very fast heartbeat. How is this treated? Changing the way you eat and drink, such as: Eating more foods that contain vitamin B12. Drinking little or no alcohol. Getting vitamin B12 shots. Taking vitamin B12 supplements by mouth (orally). Your doctor will tell you the dose that is best for you. Follow  these instructions at home: Eating and drinking  Eat foods that come from animals and have a lot of vitamin B12 in them. These include: Meats and poultry. This includes beef, pork, chicken, turkey, and organ meats, such as liver. Seafood, such as clams, rainbow trout, salmon, tuna, and haddock. Eggs. Dairy foods such as milk, yogurt, and cheese. Eat breakfast cereals that have vitamin B12 added to them (are fortified). Check the label. The items listed above may not be a complete list of foods and beverages you can eat and drink. Contact a dietitian for more information. Alcohol use Do not drink alcohol if: Your doctor tells you not to drink. You are pregnant, may be pregnant, or are planning to become pregnant. If you drink alcohol: Limit how much you have to: 0-1 drink a day for women. 0-2 drinks a day for men. Know how much alcohol is in your drink. In the [place] one drink equals one 12 oz bottle of beer (355 mL), one 5 oz glass of wine (148 mL), or one 1 oz glass of hard liquor (44 mL). General instructions Get any vitamin B12 shots if told by your doctor. Take supplements only as told by your doctor. Follow the directions. Keep all follow-up visits. Contact a doctor if: Your symptoms come back. Your symptoms get worse or do not get better with treatment. Get help right away if: You have trouble breathing. You have a very fast heartbeat. You have chest pain. You get dizzy. You faint. These symptoms may be an emergency. Get help right away. Call 911.   Do not wait to see if the symptoms will go away. Do not drive yourself to the hospital. Summary Vitamin B12 deficiency means that your body is not getting enough of the vitamin. In some cases, there are no symptoms of this condition. Treatment may include making a change in the way you eat and drink, getting shots, or taking supplements. Eat foods that have vitamin B12 in them. This information is not intended to replace advice  given to you by your health care provider. Make sure you discuss any questions you have with your health care provider. Document Revised: 12/31/2020 Document Reviewed: 12/31/2020 Elsevier Patient Education  2023 [employer] Inc[employer]  

## 2021-10-10 NOTE — Progress Notes (Signed)
Rich Brave [place]leton,acting as a Education administrator for [name], MD.,have documented all relevant documentation on the behalf of [name], MD,as directed by  [name], MD while in the presence of [name], MD.  This visit occurred during the SARS-CoV-2 public health emergency.  Safety protocols were in place, including screening questions prior to the visit, additional usage of staff PPE, and extensive cleaning of exam room while observing appropriate contact time as indicated for disinfecting solutions.  Subjective:     Patient ID: Danielle Silva , female    DOB: [birth year] , 61 y.o.   MRN: [id number]   Chief Complaint  Patient presents with   B12 Injection   Weight Check    HPI  Patient is here today for her first monthly vitamin b12 injection and weight check. Patient reports she is tolerating her Wegovy w/o many issues. This weekend was the first time she got nauseated. She admits it is due to eating pizza late at night.     Past Medical History:  Diagnosis Date   Anxiety    Complication of anesthesia    Depression    Diverticulitis    GERD (gastroesophageal reflux disease)    Hypertension    PONV (postoperative nausea and vomiting)      Family History  Problem Relation Age of Onset   Diabetes Mother    Hypertension Mother    Hyperlipidemia Mother    Heart disease Mother        CABG   Hypertension Father    Kidney disease Father        kidney transplant   Thyroid disease Father    Heart disease Father        A-fib   Arthritis Brother      Current Outpatient Medications:    azelastine (ASTELIN) 0.1 % nasal spray, Use in each nostril as directed, Disp: 90 mL, Rfl: 2   buPROPion (WELLBUTRIN XL) 300 MG 24 hr tablet, Take 1 tablet (300 mg total) by mouth daily., Disp: 90 tablet, Rfl: 2   calcium carbonate (OS-CAL) 600 MG TABS tablet, Take 600 mg by mouth daily. , Disp: , Rfl:    cetirizine (ZYRTEC) 5 MG tablet, Take 5 mg by mouth daily., Disp: ,  Rfl:    Cholecalciferol (VITAMIN D3) 5000 units CAPS, Take by mouth., Disp: , Rfl:    Semaglutide-Weight Management (WEGOVY) 0.5 MG/0.5ML SOAJ, Inject 0.5 mg into the skin once a week., Disp: 2 mL, Rfl: 0   valsartan-hydrochlorothiazide (DIOVAN HCT) 80-12.5 MG tablet, Take 1 tablet by mouth daily., Disp: 90 tablet, Rfl: 3   zinc gluconate 50 MG tablet, Take 50 mg by mouth daily., Disp: , Rfl:    valACYclovir (VALTREX) 1000 MG tablet, Take 1 tablet (1,000 mg total) by mouth 3 (three) times daily. (Patient not taking: Reported on 08/29/2021), Disp: 21 tablet, Rfl: 0   Allergies  Allergen Reactions   Macrobid [Nitrofurantoin Monohydrate Macrocrystals] Hives   Nsaids Other (See Comments)    Avoids bc of colitis    Percocet [Oxycodone-Acetaminophen] Itching and Nausea And Vomiting    Can take with benadryl     Review of Systems  Constitutional: Negative.   Respiratory: Negative.    Cardiovascular: Negative.   Gastrointestinal: Negative.   Neurological: Negative.   Psychiatric/Behavioral: Negative.      Today's Vitals   10/10/21 1119  BP: 116/80  Pulse: 64  Temp: 98.1 F (36.7 C)  Weight: 200 lb 9.6 oz (91 kg)  Height: '5\' 2"'$  (1.575 m)  PainSc: 0-No pain   Body mass index is 36.69 kg/m.  Wt Readings from Last 3 Encounters:  10/10/21 200 lb 9.6 oz (91 kg)  09/20/21 200 lb (90.7 kg)  09/13/21 202 lb (91.6 kg)     Objective:  Physical Exam Vitals and nursing note reviewed.  Constitutional:      Appearance: Normal appearance.  HENT:     Head: Normocephalic and atraumatic.  Eyes:     Extraocular Movements: Extraocular movements intact.  Cardiovascular:     Rate and Rhythm: Normal rate and regular rhythm.     Heart sounds: Normal heart sounds.  Pulmonary:     Effort: Pulmonary effort is normal.     Breath sounds: Normal breath sounds.  Musculoskeletal:     Cervical back: Normal range of motion.  Skin:    General: Skin is warm.  Neurological:     General: No focal  deficit present.     Mental Status: She is alert.  Psychiatric:        Mood and Affect: Mood normal.        Behavior: Behavior normal.     Assessment And Plan:     1. B12 deficiency Comments: I will check vitamin B12 IM today, she was also given vitamin B12 IM x 1. She will rto in four weeks for nurse visit/next injection.  - Vitamin B12 - cyanocobalamin ((VITAMIN B-12)) injection 1,000 mcg  2. Class 2 severe obesity due to excess calories with serious comorbidity and body mass index (BMI) of 36.0 to 36.9 in adult [facility]) Comments: She was congratulated on her 5 lb weight loss, she will start 0.'5mg'$  Wegovy on Wednesday. She will f/u in 8 weeks for re-evaluation.  x Patient was given opportunity to ask questions. Patient verbalized understanding of the plan and was able to repeat key elements of the plan. All questions were answered to their satisfaction.   I, [name], MD, have reviewed all documentation for this visit. The documentation on 10/10/21 for the exam, diagnosis, procedures, and orders are all accurate and complete.   IF YOU HAVE BEEN REFERRED TO A SPECIALIST, IT MAY TAKE 1-2 WEEKS TO SCHEDULE/PROCESS THE REFERRAL. IF YOU HAVE NOT HEARD FROM US/SPECIALIST IN TWO WEEKS, PLEASE GIVE [place] A CALL AT [phone].   THE PATIENT IS ENCOURAGED TO PRACTICE SOCIAL DISTANCING DUE TO THE COVID-19 PANDEMIC.

## 2021-10-11 ENCOUNTER — Encounter: Payer: Self-pay | Admitting: Internal Medicine

## 2021-10-11 ENCOUNTER — Other Ambulatory Visit: Payer: Self-pay | Admitting: Internal Medicine

## 2021-10-11 LAB — VITAMIN B12: Vitamin B-12: 581 pg/mL (ref 232–1245)

## 2021-10-11 MED ORDER — WEGOVY 1 MG/0.5ML ~~LOC~~ SOAJ: 1.0000 mg | SUBCUTANEOUS | 0 refills | Status: DC

## 2021-10-19 ENCOUNTER — Encounter: Payer: Self-pay | Admitting: Internal Medicine

## 2021-10-27 ENCOUNTER — Other Ambulatory Visit: Payer: Self-pay

## 2021-10-27 MED ORDER — WEGOVY 1 MG/0.5ML ~~LOC~~ SOAJ: 1.0000 mg | SUBCUTANEOUS | 0 refills | Status: DC

## 2021-11-04 DIAGNOSIS — B078 Other viral warts: Secondary | ICD-10-CM | POA: Diagnosis not present

## 2021-11-04 DIAGNOSIS — D2372 Other benign neoplasm of skin of left lower limb, including hip: Secondary | ICD-10-CM | POA: Diagnosis not present

## 2021-11-04 DIAGNOSIS — L821 Other seborrheic keratosis: Secondary | ICD-10-CM | POA: Diagnosis not present

## 2021-11-04 DIAGNOSIS — D485 Neoplasm of uncertain behavior of skin: Secondary | ICD-10-CM | POA: Diagnosis not present

## 2021-11-08 ENCOUNTER — Ambulatory Visit (INDEPENDENT_AMBULATORY_CARE_PROVIDER_SITE_OTHER): Payer: 59

## 2021-11-08 VITALS — BP 104/80 | HR 72 | Temp 98.1°F | Ht 62.0 in | Wt 200.0 lb

## 2021-11-08 DIAGNOSIS — E538 Deficiency of other specified B group vitamins: Secondary | ICD-10-CM | POA: Diagnosis not present

## 2021-11-08 MED ORDER — CYANOCOBALAMIN 1000 MCG/ML IJ SOLN
1000.0000 ug | Freq: Once | INTRAMUSCULAR | Status: AC
  Administered 2021-11-08: 1000 ug via INTRAMUSCULAR

## 2021-11-08 NOTE — Progress Notes (Signed)
Pt here for 2nd monthly b12 injection.

## 2021-11-10 ENCOUNTER — Encounter: Payer: Self-pay | Admitting: Internal Medicine

## 2021-11-11 ENCOUNTER — Other Ambulatory Visit: Payer: Self-pay

## 2021-11-11 MED ORDER — WEGOVY 1 MG/0.5ML ~~LOC~~ SOAJ: 1.0000 mg | SUBCUTANEOUS | 0 refills | Status: DC

## 2021-12-20 ENCOUNTER — Ambulatory Visit (INDEPENDENT_AMBULATORY_CARE_PROVIDER_SITE_OTHER): Payer: 59 | Admitting: Internal Medicine

## 2021-12-20 ENCOUNTER — Encounter: Payer: Self-pay | Admitting: Internal Medicine

## 2021-12-20 VITALS — BP 134/84 | HR 71 | Temp 98.4°F | Ht 62.0 in | Wt 193.6 lb

## 2021-12-20 DIAGNOSIS — E538 Deficiency of other specified B group vitamins: Secondary | ICD-10-CM | POA: Diagnosis not present

## 2021-12-20 DIAGNOSIS — E6609 Other obesity due to excess calories: Secondary | ICD-10-CM

## 2021-12-20 DIAGNOSIS — R69 Illness, unspecified: Secondary | ICD-10-CM | POA: Diagnosis not present

## 2021-12-20 DIAGNOSIS — Z6835 Body mass index (BMI) 35.0-35.9, adult: Secondary | ICD-10-CM | POA: Diagnosis not present

## 2021-12-20 DIAGNOSIS — F419 Anxiety disorder, unspecified: Secondary | ICD-10-CM

## 2021-12-20 DIAGNOSIS — I10 Essential (primary) hypertension: Secondary | ICD-10-CM

## 2021-12-20 MED ORDER — WEGOVY 1 MG/0.5ML ~~LOC~~ SOAJ: 1.0000 mg | SUBCUTANEOUS | 3 refills | Status: DC

## 2021-12-20 MED ORDER — HYDROXYZINE PAMOATE 25 MG PO CAPS: ORAL_CAPSULE | ORAL | 0 refills | Status: DC

## 2021-12-20 MED ORDER — CYANOCOBALAMIN 1000 MCG/ML IJ SOLN
1000.0000 ug | Freq: Once | INTRAMUSCULAR | Status: AC
  Administered 2021-12-20: 1000 ug via INTRAMUSCULAR

## 2021-12-20 NOTE — Progress Notes (Signed)
Danielle Silva,acting as a Education administrator for Danielle Greenland, MD.,have documented all relevant documentation on the behalf of Danielle Greenland, MD,as directed by  Danielle Greenland, MD while in the presence of Danielle Greenland, MD.    Subjective:     Patient ID: Danielle Silva , female    DOB: [birth year] , 61 y.o.   MRN: [id number]   Chief Complaint  Patient presents with  . Weight Check    HPI  Patient presents today for a weight check and 3rd b12 shot. Patient is complaint with all her medications. Patient states she is experiencing some anxiety and she states her bp has been high when she checks it at home, usually running in the 130s over 90s.   BP Readings from Last 3 Encounters: 12/20/21 : 134/84 11/08/21 : 104/80 10/10/21 : 116/80      Past Medical History:  Diagnosis Date  . Anxiety   . Complication of anesthesia   . Depression   . Diverticulitis   . GERD (gastroesophageal reflux disease)   . Hypertension   . PONV (postoperative nausea and vomiting)      Family History  Problem Relation Age of Onset  . Diabetes Mother   . Hypertension Mother   . Hyperlipidemia Mother   . Heart disease Mother        CABG  . Hypertension Father   . Kidney disease Father        kidney transplant  . Thyroid disease Father   . Heart disease Father        A-fib  . Arthritis Brother      Current Outpatient Medications:  .  azelastine (ASTELIN) 0.1 % nasal spray, Use in each nostril as directed, Disp: 90 mL, Rfl: 2 .  buPROPion (WELLBUTRIN XL) 300 MG 24 hr tablet, Take 1 tablet (300 mg total) by mouth daily., Disp: 90 tablet, Rfl: 2 .  calcium carbonate (OS-CAL) 600 MG TABS tablet, Take 600 mg by mouth daily. , Disp: , Rfl:  .  cetirizine (ZYRTEC) 5 MG tablet, Take 5 mg by mouth daily., Disp: , Rfl:  .  Cholecalciferol (VITAMIN D3) 5000 units CAPS, Take by mouth., Disp: , Rfl:  .  hydrOXYzine (VISTARIL) 25 MG capsule, One tab po q12h, Disp: 60 capsule, Rfl: 0 .   valsartan-hydrochlorothiazide (DIOVAN HCT) 80-12.5 MG tablet, Take 1 tablet by mouth daily., Disp: 90 tablet, Rfl: 3 .  zinc gluconate 50 MG tablet, Take 50 mg by mouth daily., Disp: , Rfl:  .  Semaglutide-Weight Management (WEGOVY) 1 MG/0.5ML SOAJ, Inject 1 mg into the skin once a week., Disp: 2 mL, Rfl: 3  Current Facility-Administered Medications:  .  cyanocobalamin (VITAMIN B12) injection 1,000 mcg, 1,000 mcg, Intramuscular, Once, Danielle Chard, MD   Allergies  Allergen Reactions  . Macrobid [Nitrofurantoin Monohydrate Macrocrystals] Hives  . Nsaids Other (See Comments)    Avoids bc of colitis   . Percocet [Oxycodone-Acetaminophen] Itching and Nausea And Vomiting    Can take with benadryl     Review of Systems  Constitutional: Negative.   HENT: Negative.    Eyes: Negative.   Respiratory: Negative.    Cardiovascular: Negative.   Gastrointestinal: Negative.      Today's Vitals   12/20/21 1210  BP: 134/84  Pulse: 71  Temp: 98.4 F (36.9 C)  TempSrc: Oral  Weight: 193 lb 9.6 oz (87.8 kg)  Height: '5\' 2"'$  (1.575 m)  PainSc: 0-No pain   Body mass index is  35.41 kg/m.  Wt Readings from Last 3 Encounters:  12/20/21 193 lb 9.6 oz (87.8 kg)  11/08/21 200 lb (90.7 kg)  10/10/21 200 lb 9.6 oz (91 kg)     Objective:  Physical Exam Vitals and nursing note reviewed.  Constitutional:      Appearance: Normal appearance.  HENT:     Head: Normocephalic and atraumatic.  Eyes:     Extraocular Movements: Extraocular movements intact.  Cardiovascular:     Rate and Rhythm: Normal rate and regular rhythm.     Heart sounds: Normal heart sounds.  Pulmonary:     Effort: Pulmonary effort is normal.     Breath sounds: Normal breath sounds.  Musculoskeletal:     Cervical back: Normal range of motion.  Skin:    General: Skin is warm.  Neurological:     General: No focal deficit present.     Mental Status: She is alert.  Psychiatric:        Mood and Affect: Mood normal.         Behavior: Behavior normal.        Assessment And Plan:     1. Class 2 obesity due to excess calories without serious comorbidity with body mass index (BMI) of 35.0 to 35.9 in adult  2. Anxiety  3. B12 deficiency - cyanocobalamin (VITAMIN B12) injection 1,000 mcg     Patient was given opportunity to ask questions. Patient verbalized understanding of the plan and was able to repeat key elements of the plan. All questions were answered to their satisfaction.   I, Danielle Greenland, MD, have reviewed all documentation for this visit. The documentation on 12/20/21 for the exam, diagnosis, procedures, and orders are all accurate and complete.   IF YOU HAVE BEEN REFERRED TO A SPECIALIST, IT MAY TAKE 1-2 WEEKS TO SCHEDULE/PROCESS THE REFERRAL. IF YOU HAVE NOT HEARD FROM US/SPECIALIST IN TWO WEEKS, PLEASE GIVE [place] A CALL AT [phone].   THE PATIENT IS ENCOURAGED TO PRACTICE SOCIAL DISTANCING DUE TO THE COVID-19 PANDEMIC.

## 2021-12-20 NOTE — Patient Instructions (Signed)
Obesity, Adult Obesity is the condition of having too much total body fat. Being overweight or obese means that your weight is greater than what is considered healthy for your body size. Obesity is determined by a measurement called BMI (body mass index). BMI is an estimate of body fat and is calculated from height and weight. For adults, a BMI of 30 or higher is considered obese. Obesity can lead to other health concerns and major illnesses, including: Stroke. Coronary artery disease (CAD). Type 2 diabetes. Some types of cancer, including cancers of the colon, breast, uterus, and gallbladder. High blood pressure (hypertension). High cholesterol. Gallbladder stones. Obesity can also contribute to: Osteoarthritis. Sleep apnea. Infertility problems. What are the causes? Common causes of this condition include: Eating daily meals that are high in calories, sugar, and fat. Drinking high amounts of sugar-sweetened beverages, such as soft drinks. Being born with genes that may make you more likely to become obese. Having a medical condition that causes obesity, including: Hypothyroidism. Polycystic ovarian syndrome (PCOS). Binge-eating disorder. Cushing syndrome. Taking certain medicines, such as steroids, antidepressants, and seizure medicines. Not being physically active (sedentary lifestyle). Not getting enough sleep. What increases the risk? The following factors may make you more likely to develop this condition: Having a family history of obesity. Living in an area with limited access to: Parks, recreation centers, or sidewalks. Healthy food choices, such as grocery stores and farmers' markets. What are the signs or symptoms? The main sign of this condition is having too much body fat. How is this diagnosed? This condition is diagnosed based on: Your BMI. If you are an adult with a BMI of 30 or higher, you are considered obese. Your waist circumference. This measures the  distance around your waistline. Your skinfold thickness. Your health care provider may gently pinch a fold of your skin and measure it. You may have other tests to check for underlying conditions. How is this treated? Treatment for this condition often includes changing your lifestyle. Treatment may include some or all of the following: Dietary changes. This may include developing a healthy meal plan. Regular physical activity. This may include activity that causes your heart to beat faster (aerobic exercise) and strength training. Work with your health care provider to design an exercise program that works for you. Medicine to help you lose weight if you are unable to lose one pound a week after six weeks of healthy eating and more physical activity. Treating conditions that cause the obesity (underlying conditions). Surgery. Surgical options may include gastric banding and gastric bypass. Surgery may be done if: Other treatments have not helped to improve your condition. You have a BMI of 40 or higher. You have life-threatening health problems related to obesity. Follow these instructions at home: Eating and drinking  Follow recommendations from your health care provider about what you eat and drink. Your health care provider may advise you to: Limit fast food, sweets, and processed snack foods. Choose low-fat options, such as low-fat milk instead of whole milk. Eat five or more servings of fruits or vegetables every day. Choose healthy foods when you eat out. Keep low-fat snacks available. Limit sugary drinks, such as soda, fruit juice, sweetened iced tea, and flavored milk. Drink enough water to keep your urine pale yellow. Do not follow a fad diet. Fad diets can be unhealthy and even dangerous. Other healthful choices include: Eat at home more often. This gives you more control over what you eat. Learn to read food labels.   This will help you understand how much food is considered one  serving. Learn what a healthy serving size is. Physical activity Exercise regularly, as told by your health care provider. Most adults should get up to 150 minutes of moderate-intensity exercise every week. Ask your health care provider what types of exercise are safe for you and how often you should exercise. Warm up and stretch before being active. Cool down and stretch after being active. Rest between periods of activity. Lifestyle Work with your health care provider and a dietitian to set a weight-loss goal that is healthy and reasonable for you. Limit your screen time. Find ways to reward yourself that do not involve food. Do not drink alcohol if: Your health care provider tells you not to drink. You are pregnant, may be pregnant, or are planning to become pregnant. If you drink alcohol: Limit how much you have to: 0-1 drink a day for women. 0-2 drinks a day for men. Know how much alcohol is in your drink. In the [place] one drink equals one 12 oz bottle of beer (355 mL), one 5 oz glass of wine (148 mL), or one 1 oz glass of hard liquor (44 mL). General instructions Keep a weight-loss journal to keep track of the food you eat and how much exercise you get. Take over-the-counter and prescription medicines only as told by your health care provider. Take vitamins and supplements only as told by your health care provider. Consider joining a support group. Your health care provider may be able to recommend a support group. Pay attention to your mental health as obesity can lead to depression or self esteem issues. Keep all follow-up visits. This is important. Contact a health care provider if: You are unable to meet your weight-loss goal after six weeks of dietary and lifestyle changes. You have trouble breathing. Summary Obesity is the condition of having too much total body fat. Being overweight or obese means that your weight is greater than what is considered healthy for your body  size. Work with your health care provider and a dietitian to set a weight-loss goal that is healthy and reasonable for you. Exercise regularly, as told by your health care provider. Ask your health care provider what types of exercise are safe for you and how often you should exercise. This information is not intended to replace advice given to you by your health care provider. Make sure you discuss any questions you have with your health care provider. Document Revised: 12/14/2020 Document Reviewed: 12/14/2020 Elsevier Patient Education  2023 [employer]  

## 2021-12-27 ENCOUNTER — Encounter: Payer: Self-pay | Admitting: Internal Medicine

## 2022-01-11 ENCOUNTER — Other Ambulatory Visit: Payer: Self-pay | Admitting: Internal Medicine

## 2022-02-20 ENCOUNTER — Ambulatory Visit (INDEPENDENT_AMBULATORY_CARE_PROVIDER_SITE_OTHER): Payer: 59 | Admitting: Internal Medicine

## 2022-02-20 ENCOUNTER — Encounter: Payer: Self-pay | Admitting: Internal Medicine

## 2022-02-20 VITALS — BP 132/68 | HR 95 | Temp 97.8°F | Ht 62.0 in | Wt 192.4 lb

## 2022-02-20 DIAGNOSIS — F419 Anxiety disorder, unspecified: Secondary | ICD-10-CM

## 2022-02-20 DIAGNOSIS — I10 Essential (primary) hypertension: Secondary | ICD-10-CM | POA: Diagnosis not present

## 2022-02-20 DIAGNOSIS — H6690 Otitis media, unspecified, unspecified ear: Secondary | ICD-10-CM | POA: Diagnosis not present

## 2022-02-20 DIAGNOSIS — E6609 Other obesity due to excess calories: Secondary | ICD-10-CM | POA: Diagnosis not present

## 2022-02-20 DIAGNOSIS — E538 Deficiency of other specified B group vitamins: Secondary | ICD-10-CM

## 2022-02-20 DIAGNOSIS — Z6835 Body mass index (BMI) 35.0-35.9, adult: Secondary | ICD-10-CM

## 2022-02-20 DIAGNOSIS — I119 Hypertensive heart disease without heart failure: Secondary | ICD-10-CM | POA: Insufficient documentation

## 2022-02-20 DIAGNOSIS — J3489 Other specified disorders of nose and nasal sinuses: Secondary | ICD-10-CM | POA: Diagnosis not present

## 2022-02-20 DIAGNOSIS — R69 Illness, unspecified: Secondary | ICD-10-CM | POA: Diagnosis not present

## 2022-02-20 MED ORDER — CYANOCOBALAMIN 1000 MCG/ML IJ SOLN
1000.0000 ug | Freq: Once | INTRAMUSCULAR | Status: AC
  Administered 2022-02-20: 1000 ug via INTRAMUSCULAR

## 2022-02-20 MED ORDER — WEGOVY 1.7 MG/0.75ML ~~LOC~~ SOAJ: 1.7000 mg | SUBCUTANEOUS | 1 refills | Status: DC

## 2022-02-20 MED ORDER — AMOXICILLIN-POT CLAVULANATE 875-125 MG PO TABS: 1.0000 | ORAL_TABLET | Freq: Two times a day (BID) | ORAL | 0 refills | Status: DC

## 2022-02-20 NOTE — Progress Notes (Signed)
Danielle Silva,acting as a Education administrator for Danielle Greenland, MD.,have documented all relevant documentation on the behalf of Danielle Greenland, MD,as directed by  Danielle Greenland, MD while in the presence of Danielle Greenland, MD.    Subjective:     Patient ID: Danielle Silva , female    DOB: [birth year] , 61 y.o.   MRN: [id number]   Chief Complaint  Patient presents with   Weight Check    HPI  Patient presents today for a weight check & bpc. Patient is complaint with all her medications. Patient doesn't have any other issues today.  She thinks she has a sinus infection. At home covid test she took on Friday, negative.  Stuffy & runny nose, face hurts, head hurts. She reports this has been ongoing for 2 weeks.         Past Medical History:  Diagnosis Date   Anxiety    Complication of anesthesia    Depression    Diverticulitis    GERD (gastroesophageal reflux disease)    Hypertension    PONV (postoperative nausea and vomiting)      Family History  Problem Relation Age of Onset   Diabetes Mother    Hypertension Mother    Hyperlipidemia Mother    Heart disease Mother        CABG   Hypertension Father    Kidney disease Father        kidney transplant   Thyroid disease Father    Heart disease Father        A-fib   Arthritis Brother      Current Outpatient Medications:    amoxicillin-clavulanate (AUGMENTIN) 875-125 MG tablet, Take 1 tablet by mouth 2 (two) times daily., Disp: 14 tablet, Rfl: 0   azelastine (ASTELIN) 0.1 % nasal spray, Use in each nostril as directed, Disp: 90 mL, Rfl: 2   buPROPion (WELLBUTRIN XL) 300 MG 24 hr tablet, Take 1 tablet (300 mg total) by mouth daily., Disp: 90 tablet, Rfl: 2   calcium carbonate (OS-CAL) 600 MG TABS tablet, Take 600 mg by mouth daily. , Disp: , Rfl:    cetirizine (ZYRTEC) 5 MG tablet, Take 5 mg by mouth daily., Disp: , Rfl:    Cholecalciferol (VITAMIN D3) 5000 units CAPS, Take by mouth., Disp: , Rfl:    hydrOXYzine (VISTARIL) 25  MG capsule, TAKE 1 CAPSULE BY MOUTH EVERY 12 HOURS, Disp: 180 capsule, Rfl: 1   Semaglutide-Weight Management (WEGOVY) 1.7 MG/0.75ML SOAJ, Inject 1.7 mg into the skin once a week., Disp: 3 mL, Rfl: 1   valsartan-hydrochlorothiazide (DIOVAN HCT) 80-12.5 MG tablet, Take 1 tablet by mouth daily., Disp: 90 tablet, Rfl: 3   zinc gluconate 50 MG tablet, Take 50 mg by mouth daily., Disp: , Rfl:    Allergies  Allergen Reactions   Macrobid [Nitrofurantoin Monohydrate Macrocrystals] Hives   Nsaids Other (See Comments)    Avoids bc of colitis    Percocet [Oxycodone-Acetaminophen] Itching and Nausea And Vomiting    Can take with benadryl     Review of Systems  Constitutional: Negative.   HENT: Negative.    Eyes: Negative.   Respiratory: Negative.    Cardiovascular: Negative.   Gastrointestinal: Negative.      Today's Vitals   02/20/22 1207  BP: 132/68  Pulse: 95  Temp: 97.8 F (36.6 C)  SpO2: 98%  Weight: 192 lb 6.4 oz (87.3 kg)  Height: 5' 2"  (1.575 m)  PainSc: 0-No pain   Body  mass index is 35.19 kg/m.  Wt Readings from Last 3 Encounters:  02/20/22 192 lb 6.4 oz (87.3 kg)  12/20/21 193 lb 9.6 oz (87.8 kg)  11/08/21 200 lb (90.7 kg)    Objective:  Physical Exam Vitals and nursing note reviewed.  Constitutional:      Appearance: Normal appearance.  HENT:     Head: Normocephalic and atraumatic.     Right Ear: Ear canal and external ear normal. Tympanic membrane is scarred and bulging.     Left Ear: Tympanic membrane, ear canal and external ear normal. There is no impacted cerumen.  Eyes:     Extraocular Movements: Extraocular movements intact.  Cardiovascular:     Rate and Rhythm: Normal rate and regular rhythm.     Heart sounds: Normal heart sounds.  Pulmonary:     Effort: Pulmonary effort is normal.     Breath sounds: Normal breath sounds.  Musculoskeletal:     Cervical back: Normal range of motion.  Skin:    General: Skin is warm.  Neurological:     General: No  focal deficit present.     Mental Status: She is alert.  Psychiatric:        Mood and Affect: Mood normal.        Behavior: Behavior normal.      Assessment And Plan:     1. Acute otitis media, unspecified otitis media type Comments: I will send rx Augmentin to take twice daily prn. She is encouraged to take the full course.  - Novel Coronavirus, NAA ([facility])  2. Essential hypertension, benign Comments: Chronic, fair control. Goal BP<130/80. She will c/w valsartan/hct 80/12.45m daily. I will check renal function today.  She will f/u in 6 months for CPE.  - CMP14+EGFR  3. B12 deficiency Comments: She was given vitamin B12 IM x 1.  - cyanocobalamin (VITAMIN B12) injection 1,000 mcg  4. Anxiety Comments: Chronic, she will c/w bupropion daily.   5. Class 2 obesity due to excess calories without serious comorbidity with body mass index (BMI) of 35.0 to 35.9 in adult Comments: I will gradually increase [facility]. Rx sent to pharmacy, she will f/u in 8-10 weeks.  - Semaglutide-Weight Management (WEGOVY) 1.7 MG/0.75ML SOAJ; Inject 1.7 mg into the skin once a week.  Dispense: 3 mL; Refill: 1  Patient was given opportunity to ask questions. Patient verbalized understanding of the plan and was able to repeat key elements of the plan. All questions were answered to their satisfaction.   I, RoMaximino GreenlandMD, have reviewed all documentation for this visit. The documentation on 02/20/22 for the exam, diagnosis, procedures, and orders are all accurate and complete.   IF YOU HAVE BEEN REFERRED TO A SPECIALIST, IT MAY TAKE 1-2 WEEKS TO SCHEDULE/PROCESS THE REFERRAL. IF YOU HAVE NOT HEARD FROM US/SPECIALIST IN TWO WEEKS, PLEASE GIVE [place] CALL AT [phone].   THE PATIENT IS ENCOURAGED TO PRACTICE SOCIAL DISTANCING DUE TO THE COVID-19 PANDEMIC.

## 2022-02-20 NOTE — Patient Instructions (Signed)

## 2022-02-21 LAB — CMP14+EGFR
ALT: 12 IU/L (ref 0–32)
AST: 20 IU/L (ref 0–40)
Albumin/Globulin Ratio: 1.6 (ref 1.2–2.2)
Albumin: 4.5 g/dL (ref 3.8–4.9)
Alkaline Phosphatase: 102 IU/L (ref 44–121)
BUN/Creatinine Ratio: 12 (ref 9–23)
BUN: 11 mg/dL (ref 6–24)
Bilirubin Total: 0.5 mg/dL (ref 0.0–1.2)
CO2: 22 mmol/L (ref 20–29)
Calcium: 9.6 mg/dL (ref 8.7–10.2)
Chloride: 96 mmol/L (ref 96–106)
Creatinine, Ser: 0.92 mg/dL (ref 0.57–1.00)
Globulin, Total: 2.8 g/dL (ref 1.5–4.5)
Glucose: 97 mg/dL (ref 70–99)
Potassium: 4.1 mmol/L (ref 3.5–5.2)
Sodium: 135 mmol/L (ref 134–144)
Total Protein: 7.3 g/dL (ref 6.0–8.5)
eGFR: 72 mL/min/{1.73_m2} (ref >59)

## 2022-02-21 LAB — NOVEL CORONAVIRUS, NAA: SARS-CoV-2, NAA: NOT DETECTED

## 2022-02-24 ENCOUNTER — Encounter: Payer: Self-pay | Admitting: Internal Medicine

## 2022-02-28 ENCOUNTER — Ambulatory Visit: Payer: 59 | Admitting: Internal Medicine

## 2022-04-06 ENCOUNTER — Ambulatory Visit: Payer: 59 | Admitting: Internal Medicine

## 2022-04-23 ENCOUNTER — Encounter: Payer: Self-pay | Admitting: Internal Medicine

## 2022-04-24 ENCOUNTER — Other Ambulatory Visit: Payer: Self-pay

## 2022-04-24 DIAGNOSIS — E6609 Other obesity due to excess calories: Secondary | ICD-10-CM

## 2022-04-24 MED ORDER — WEGOVY 1.7 MG/0.75ML ~~LOC~~ SOAJ: 1.7000 mg | SUBCUTANEOUS | 1 refills | Status: DC

## 2022-05-01 ENCOUNTER — Encounter: Payer: Self-pay | Admitting: Internal Medicine

## 2022-05-02 ENCOUNTER — Encounter: Payer: Self-pay | Admitting: Internal Medicine

## 2022-05-04 ENCOUNTER — Encounter: Payer: Self-pay | Admitting: Internal Medicine

## 2022-05-04 ENCOUNTER — Ambulatory Visit (INDEPENDENT_AMBULATORY_CARE_PROVIDER_SITE_OTHER): Payer: 59 | Admitting: Internal Medicine

## 2022-05-04 VITALS — BP 118/80 | HR 68 | Temp 97.5°F | Ht 60.4 in | Wt 191.8 lb

## 2022-05-04 DIAGNOSIS — I10 Essential (primary) hypertension: Secondary | ICD-10-CM | POA: Diagnosis not present

## 2022-05-04 DIAGNOSIS — Z6836 Body mass index (BMI) 36.0-36.9, adult: Secondary | ICD-10-CM

## 2022-05-04 NOTE — Patient Instructions (Signed)

## 2022-05-04 NOTE — Progress Notes (Signed)
Rich Brave Llittleton,acting as a Education administrator for [name], MD.,have documented all relevant documentation on the behalf of [name], MD,as directed by  [name], MD while in the presence of [name], MD.    Subjective:     Patient ID: Danielle Silva , female    DOB: 1960-09-19 , 61 y.o.   MRN: [id number]   Chief Complaint  Patient presents with   Weight Check    HPI  Patient presents today for a weight check. Patient is currently taking wegovy 1.'7mg'$  and is tolerating it well.        Past Medical History:  Diagnosis Date   Anxiety    Complication of anesthesia    Depression    Diverticulitis    GERD (gastroesophageal reflux disease)    Hypertension    PONV (postoperative nausea and vomiting)      Family History  Problem Relation Age of Onset   Diabetes Mother    Hypertension Mother    Hyperlipidemia Mother    Heart disease Mother        CABG   Hypertension Father    Kidney disease Father        kidney transplant   Thyroid disease Father    Heart disease Father        A-fib   Arthritis Brother      Current Outpatient Medications:    azelastine (ASTELIN) 0.1 % nasal spray, Use in each nostril as directed, Disp: 90 mL, Rfl: 2   buPROPion (WELLBUTRIN XL) 300 MG 24 hr tablet, Take 1 tablet (300 mg total) by mouth daily., Disp: 90 tablet, Rfl: 2   calcium carbonate (OS-CAL) 600 MG TABS tablet, Take 600 mg by mouth daily. , Disp: , Rfl:    cetirizine (ZYRTEC) 5 MG tablet, Take 5 mg by mouth daily., Disp: , Rfl:    Cholecalciferol (VITAMIN D3) 5000 units CAPS, Take by mouth., Disp: , Rfl:    hydrOXYzine (VISTARIL) 25 MG capsule, TAKE 1 CAPSULE BY MOUTH EVERY 12 HOURS, Disp: 180 capsule, Rfl: 1   Semaglutide-Weight Management (WEGOVY) 1.7 MG/0.75ML SOAJ, Inject 1.7 mg into the skin once a week., Disp: 3 mL, Rfl: 1   valsartan-hydrochlorothiazide (DIOVAN HCT) 80-12.5 MG tablet, Take 1 tablet by mouth daily., Disp: 90 tablet, Rfl: 3   zinc  gluconate 50 MG tablet, Take 50 mg by mouth daily., Disp: , Rfl:    Allergies  Allergen Reactions   Macrobid [Nitrofurantoin Monohydrate Macrocrystals] Hives   Nsaids Other (See Comments)    Avoids bc of colitis    Percocet [Oxycodone-Acetaminophen] Itching and Nausea And Vomiting    Can take with benadryl     Review of Systems  Constitutional: Negative.   Respiratory: Negative.    Cardiovascular: Negative.   Gastrointestinal: Negative.   Neurological: Negative.   Psychiatric/Behavioral: Negative.       Today's Vitals   05/04/22 1553  BP: 118/80  Pulse: 68  Temp: (!) 97.5 F (36.4 C)  Weight: 191 lb 12.8 oz (87 kg)  Height: 5' 0.4" (1.534 m)  PainSc: 0-No pain   Body mass index is 36.96 kg/m.  Wt Readings from Last 3 Encounters:  05/04/22 191 lb 12.8 oz (87 kg)  02/20/22 192 lb 6.4 oz (87.3 kg)  12/20/21 193 lb 9.6 oz (87.8 kg)     Objective:  Physical Exam Vitals and nursing note reviewed.  Constitutional:      Appearance: Normal appearance.  HENT:  Head: Normocephalic and atraumatic.     Nose:     Comments: Masked     Mouth/Throat:     Comments: Masked   Eyes:     Extraocular Movements: Extraocular movements intact.  Cardiovascular:     Rate and Rhythm: Normal rate and regular rhythm.     Heart sounds: Normal heart sounds.  Pulmonary:     Effort: Pulmonary effort is normal.     Breath sounds: Normal breath sounds.  Musculoskeletal:     Cervical back: Normal range of motion.  Skin:    General: Skin is warm.  Neurological:     General: No focal deficit present.     Mental Status: She is alert.  Psychiatric:        Mood and Affect: Mood normal.        Behavior: Behavior normal.         Assessment And Plan:     1. Class 2 severe obesity due to excess calories with serious comorbidity and body mass index (BMI) of 36.0 to 36.9 in adult [facility])     Patient was given opportunity to ask questions. Patient verbalized understanding of the plan  and was able to repeat key elements of the plan. All questions were answered to their satisfaction.   I, [name], MD, have reviewed all documentation for this visit. The documentation on 05/04/22 for the exam, diagnosis, procedures, and orders are all accurate and complete.   IF YOU HAVE BEEN REFERRED TO A SPECIALIST, IT MAY TAKE 1-2 WEEKS TO SCHEDULE/PROCESS THE REFERRAL. IF YOU HAVE NOT HEARD FROM US/SPECIALIST IN TWO WEEKS, PLEASE GIVE [place] A CALL AT [phone].   THE PATIENT IS ENCOURAGED TO PRACTICE SOCIAL DISTANCING DUE TO THE COVID-19 PANDEMIC.

## 2022-05-08 ENCOUNTER — Encounter: Payer: Self-pay | Admitting: Internal Medicine

## 2022-05-23 ENCOUNTER — Other Ambulatory Visit: Payer: Self-pay | Admitting: Internal Medicine

## 2022-05-25 ENCOUNTER — Other Ambulatory Visit: Payer: Self-pay | Admitting: Internal Medicine

## 2022-05-29 ENCOUNTER — Other Ambulatory Visit: Payer: Self-pay | Admitting: Internal Medicine

## 2022-06-27 ENCOUNTER — Encounter: Payer: Self-pay | Admitting: Internal Medicine

## 2022-06-28 ENCOUNTER — Other Ambulatory Visit: Payer: Self-pay

## 2022-06-28 DIAGNOSIS — E6609 Other obesity due to excess calories: Secondary | ICD-10-CM

## 2022-06-28 MED ORDER — WEGOVY 1.7 MG/0.75ML ~~LOC~~ SOAJ: 1.7000 mg | SUBCUTANEOUS | 1 refills | Status: DC

## 2022-06-29 ENCOUNTER — Ambulatory Visit: Payer: 59 | Admitting: Internal Medicine

## 2022-07-19 ENCOUNTER — Encounter: Payer: Self-pay | Admitting: Internal Medicine

## 2022-07-19 ENCOUNTER — Ambulatory Visit (INDEPENDENT_AMBULATORY_CARE_PROVIDER_SITE_OTHER): Payer: 59 | Admitting: Internal Medicine

## 2022-07-19 VITALS — BP 120/84 | HR 74 | Temp 97.4°F | Ht 60.0 in | Wt 180.6 lb

## 2022-07-19 DIAGNOSIS — E6609 Other obesity due to excess calories: Secondary | ICD-10-CM | POA: Diagnosis not present

## 2022-07-19 DIAGNOSIS — F419 Anxiety disorder, unspecified: Secondary | ICD-10-CM | POA: Diagnosis not present

## 2022-07-19 DIAGNOSIS — E559 Vitamin D deficiency, unspecified: Secondary | ICD-10-CM | POA: Diagnosis not present

## 2022-07-19 DIAGNOSIS — Z8616 Personal history of COVID-19: Secondary | ICD-10-CM

## 2022-07-19 DIAGNOSIS — Z2821 Immunization not carried out because of patient refusal: Secondary | ICD-10-CM | POA: Diagnosis not present

## 2022-07-19 DIAGNOSIS — E538 Deficiency of other specified B group vitamins: Secondary | ICD-10-CM | POA: Diagnosis not present

## 2022-07-19 DIAGNOSIS — I10 Essential (primary) hypertension: Secondary | ICD-10-CM | POA: Diagnosis not present

## 2022-07-19 DIAGNOSIS — Z6835 Body mass index (BMI) 35.0-35.9, adult: Secondary | ICD-10-CM | POA: Diagnosis not present

## 2022-07-19 MED ORDER — CYANOCOBALAMIN 1000 MCG/ML IJ SOLN
1000.0000 ug | Freq: Once | INTRAMUSCULAR | Status: AC
  Administered 2022-07-19: 1000 ug via INTRAMUSCULAR

## 2022-07-19 MED ORDER — WEGOVY 1.7 MG/0.75ML ~~LOC~~ SOAJ: 1.7000 mg | SUBCUTANEOUS | 1 refills | Status: DC

## 2022-07-19 MED ORDER — BUPROPION HCL ER (XL) 300 MG PO TB24: 300.0000 mg | ORAL_TABLET | Freq: Every day | ORAL | 2 refills | Status: DC

## 2022-07-19 MED ORDER — VALSARTAN-HYDROCHLOROTHIAZIDE 80-12.5 MG PO TABS: 1.0000 | ORAL_TABLET | Freq: Every day | ORAL | 3 refills | Status: DC

## 2022-07-19 NOTE — Patient Instructions (Signed)

## 2022-07-19 NOTE — Progress Notes (Signed)
I,Victoria T Hamilton,acting as a scribe for [name], MD.,have documented all relevant documentation on the behalf of [name], MD,as directed by  [name], MD while in the presence of [name], MD.    Subjective:     Patient ID: Danielle Silva , female    DOB: 1961/03/20 , 62 y.o.   MRN: [id number]   Chief Complaint  Patient presents with   Hypertension    HPI  Patient presents today for a BP and weight check. Patient is currently taking wegovy 1.'7mg'$  weekly and is tolerating it well. She denies having any specific questions or concerns.      Hypertension This is a chronic problem. The current episode started more than 1 year ago. The problem has been gradually improving since onset. The problem is controlled. Pertinent negatives include no blurred vision, chest pain or shortness of breath. Risk factors for coronary artery disease include sedentary lifestyle and post-menopausal state. The current treatment provides moderate improvement. There is no history of kidney disease or CAD/MI.     Past Medical History:  Diagnosis Date   Anxiety    Complication of anesthesia    Depression    Diverticulitis    GERD (gastroesophageal reflux disease)    Hypertension    PONV (postoperative nausea and vomiting)      Family History  Problem Relation Age of Onset   Diabetes Mother    Hypertension Mother    Hyperlipidemia Mother    Heart disease Mother        CABG   Hypertension Father    Kidney disease Father        kidney transplant   Thyroid disease Father    Heart disease Father        A-fib   Arthritis Brother      Current Outpatient Medications:    Azelastine HCl 137 MCG/SPRAY SOLN, USE IN EACH NOSTRIL AS DIRECTED, Disp: 90 mL, Rfl: 2   calcium carbonate (OS-CAL) 600 MG TABS tablet, Take 600 mg by mouth daily. , Disp: , Rfl:    cetirizine (ZYRTEC) 5 MG tablet, Take 5 mg by mouth daily., Disp: , Rfl:    Cholecalciferol (VITAMIN D3) 5000 units  CAPS, Take by mouth., Disp: , Rfl:    hydrOXYzine (VISTARIL) 25 MG capsule, TAKE 1 CAPSULE BY MOUTH EVERY 12 HOURS, Disp: 180 capsule, Rfl: 1   zinc gluconate 50 MG tablet, Take 50 mg by mouth daily., Disp: , Rfl:    buPROPion (WELLBUTRIN XL) 300 MG 24 hr tablet, Take 1 tablet (300 mg total) by mouth daily., Disp: 90 tablet, Rfl: 2   Semaglutide-Weight Management (WEGOVY) 1.7 MG/0.75ML SOAJ, Inject 1.7 mg into the skin once a week., Disp: 3 mL, Rfl: 1   valsartan-hydrochlorothiazide (DIOVAN HCT) 80-12.5 MG tablet, Take 1 tablet by mouth daily., Disp: 90 tablet, Rfl: 3   Allergies  Allergen Reactions   Macrobid [Nitrofurantoin Monohydrate Macrocrystals] Hives   Nsaids Other (See Comments)    Avoids bc of colitis    Percocet [Oxycodone-Acetaminophen] Itching and Nausea And Vomiting    Can take with benadryl     Review of Systems  Constitutional: Negative.   HENT:         She reports having COVID about 3 weeks ago. She feels better now, still w/ cough. No fever/chills.   Eyes:  Negative for blurred vision.  Respiratory: Negative.  Negative for shortness of breath.   Cardiovascular: Negative.  Negative for chest pain.  Gastrointestinal: Negative.   Neurological: Negative.   Psychiatric/Behavioral: Negative.       Today's Vitals   07/19/22 1428  BP: 120/84  Pulse: 74  Temp: (!) 97.4 F (36.3 C)  SpO2: 98%  Weight: 180 lb 9.6 oz (81.9 kg)  Height: 5' (1.524 m)   Body mass index is 35.27 kg/m.  Wt Readings from Last 3 Encounters:  07/19/22 180 lb 9.6 oz (81.9 kg)  05/04/22 191 lb 12.8 oz (87 kg)  02/20/22 192 lb 6.4 oz (87.3 kg)    Objective:  Physical Exam Vitals and nursing note reviewed.  Constitutional:      Appearance: Normal appearance.  HENT:     Head: Normocephalic and atraumatic.     Nose:     Comments: Masked     Mouth/Throat:     Comments: Masked  Eyes:     Extraocular Movements: Extraocular movements intact.  Cardiovascular:     Rate and Rhythm: Normal  rate and regular rhythm.     Heart sounds: Normal heart sounds.  Pulmonary:     Effort: Pulmonary effort is normal.     Breath sounds: Normal breath sounds.  Musculoskeletal:     Cervical back: Normal range of motion.  Skin:    General: Skin is warm.  Neurological:     General: No focal deficit present.     Mental Status: She is alert.  Psychiatric:        Mood and Affect: Mood normal.        Behavior: Behavior normal.      Assessment And Plan:     1. Essential hypertension, benign Comments: Chronic, controlled. Slight diastolic elevation noted. Advised to follow low sodium diet. She will c/w valsartan/hct. She will rto in six months for f/u. - cyanocobalamin (VITAMIN B12) injection 1,000 mcg - valsartan-hydrochlorothiazide (DIOVAN HCT) 80-12.5 MG tablet; Take 1 tablet by mouth daily.  Dispense: 90 tablet; Refill: 3  2. Class 2 obesity due to excess calories without serious comorbidity with body mass index (BMI) of 35.0 to 35.9 in adult - Semaglutide-Weight Management (WEGOVY) 1.7 MG/0.75ML SOAJ; Inject 1.7 mg into the skin once a week.  Dispense: 3 mL; Refill: 1  3. B12 deficiency Comments: I will check a vitamin B12 level today. She was also given vitamin B12 IM x1. - Vitamin B12  4. Anxiety Comments: Chronic, stable. She will c/w Wellbutrin XL  daily.  5. Vitamin D deficiency Comments: I will check a vitamin D level and supplement as needed. - Vitamin D (25 hydroxy)  6. Personal history of COVID-19 Comments: She reports a recent COVID infection, about 3 weeks ago. She may benefit from short course of melatonin nightly.  7. Influenza vaccination declined  8. Herpes zoster vaccination declined  Patient was given opportunity to ask questions. Patient verbalized understanding of the plan and was able to repeat key elements of the plan. All questions were answered to their satisfaction.   I, [name], MD, have reviewed all documentation for this visit. The  documentation on 07/19/22 for the exam, diagnosis, procedures, and orders are all accurate and complete.   IF YOU HAVE BEEN REFERRED TO A SPECIALIST, IT MAY TAKE 1-2 WEEKS TO SCHEDULE/PROCESS THE REFERRAL. IF YOU HAVE NOT HEARD FROM US/SPECIALIST IN TWO WEEKS, PLEASE GIVE [place] A CALL AT [phone].   THE PATIENT IS ENCOURAGED TO PRACTICE SOCIAL DISTANCING DUE TO THE COVID-19 PANDEMIC.

## 2022-07-20 LAB — VITAMIN D 25 HYDROXY (VIT D DEFICIENCY, FRACTURES): Vit D, 25-Hydroxy: 75.2 ng/mL (ref 30.0–100.0)

## 2022-07-20 LAB — VITAMIN B12: Vitamin B-12: 509 pg/mL (ref 232–1245)

## 2022-08-21 ENCOUNTER — Other Ambulatory Visit: Payer: Self-pay | Admitting: Internal Medicine

## 2022-08-21 ENCOUNTER — Encounter: Payer: Self-pay | Admitting: Internal Medicine

## 2022-08-21 MED ORDER — WEGOVY 2.4 MG/0.75ML ~~LOC~~ SOAJ: 2.4000 mg | SUBCUTANEOUS | 2 refills | Status: DC

## 2022-09-05 DIAGNOSIS — K52832 Lymphocytic colitis: Secondary | ICD-10-CM | POA: Diagnosis not present

## 2022-09-05 DIAGNOSIS — K5730 Diverticulosis of large intestine without perforation or abscess without bleeding: Secondary | ICD-10-CM | POA: Diagnosis not present

## 2022-09-05 DIAGNOSIS — E669 Obesity, unspecified: Secondary | ICD-10-CM | POA: Diagnosis not present

## 2022-09-05 DIAGNOSIS — K580 Irritable bowel syndrome with diarrhea: Secondary | ICD-10-CM | POA: Diagnosis not present

## 2022-09-05 DIAGNOSIS — K9089 Other intestinal malabsorption: Secondary | ICD-10-CM | POA: Diagnosis not present

## 2022-09-11 ENCOUNTER — Encounter: Payer: Self-pay | Admitting: Internal Medicine

## 2022-09-18 DIAGNOSIS — Z1231 Encounter for screening mammogram for malignant neoplasm of breast: Secondary | ICD-10-CM | POA: Diagnosis not present

## 2022-09-18 LAB — HM MAMMOGRAPHY

## 2022-09-19 ENCOUNTER — Encounter: Payer: 59 | Admitting: Internal Medicine

## 2022-09-20 DIAGNOSIS — M8580 Other specified disorders of bone density and structure, unspecified site: Secondary | ICD-10-CM | POA: Diagnosis not present

## 2022-09-20 DIAGNOSIS — Z780 Asymptomatic menopausal state: Secondary | ICD-10-CM | POA: Diagnosis not present

## 2022-09-20 DIAGNOSIS — Z01419 Encounter for gynecological examination (general) (routine) without abnormal findings: Secondary | ICD-10-CM | POA: Diagnosis not present

## 2022-09-20 DIAGNOSIS — I10 Essential (primary) hypertension: Secondary | ICD-10-CM | POA: Diagnosis not present

## 2022-09-21 LAB — HM PAP SMEAR: HPV, high-risk: NEGATIVE

## 2022-10-17 ENCOUNTER — Encounter: Payer: Self-pay | Admitting: Internal Medicine

## 2022-11-06 ENCOUNTER — Encounter: Payer: Self-pay | Admitting: Internal Medicine

## 2022-11-07 ENCOUNTER — Other Ambulatory Visit: Payer: Self-pay

## 2022-11-07 MED ORDER — WEGOVY 2.4 MG/0.75ML ~~LOC~~ SOAJ: 2.4000 mg | SUBCUTANEOUS | 2 refills | Status: DC

## 2022-11-10 ENCOUNTER — Other Ambulatory Visit: Payer: Self-pay | Admitting: Internal Medicine

## 2022-11-28 ENCOUNTER — Encounter: Payer: Self-pay | Admitting: Internal Medicine

## 2022-11-28 ENCOUNTER — Ambulatory Visit (INDEPENDENT_AMBULATORY_CARE_PROVIDER_SITE_OTHER): Payer: 59 | Admitting: Internal Medicine

## 2022-11-28 VITALS — BP 110/70 | HR 77 | Temp 98.0°F | Ht 60.0 in | Wt 184.2 lb

## 2022-11-28 DIAGNOSIS — Z6835 Body mass index (BMI) 35.0-35.9, adult: Secondary | ICD-10-CM | POA: Diagnosis not present

## 2022-11-28 DIAGNOSIS — I10 Essential (primary) hypertension: Secondary | ICD-10-CM

## 2022-11-28 DIAGNOSIS — E7800 Pure hypercholesterolemia, unspecified: Secondary | ICD-10-CM | POA: Diagnosis not present

## 2022-11-28 DIAGNOSIS — Z0000 Encounter for general adult medical examination without abnormal findings: Secondary | ICD-10-CM

## 2022-11-28 DIAGNOSIS — Z2821 Immunization not carried out because of patient refusal: Secondary | ICD-10-CM

## 2022-11-28 DIAGNOSIS — Z0001 Encounter for general adult medical examination with abnormal findings: Secondary | ICD-10-CM

## 2022-11-28 DIAGNOSIS — M545 Low back pain, unspecified: Secondary | ICD-10-CM

## 2022-11-28 LAB — POCT URINALYSIS DIPSTICK
Bilirubin, UA: NEGATIVE
Blood, UA: NEGATIVE
Glucose, UA: NEGATIVE
Ketones, UA: NEGATIVE
Leukocytes, UA: NEGATIVE
Nitrite, UA: NEGATIVE
Protein, UA: NEGATIVE
Spec Grav, UA: 1.02 (ref 1.010–1.025)
Urobilinogen, UA: 0.2 E.U./dL
pH, UA: 5.5 (ref 5.0–8.0)

## 2022-11-28 MED ORDER — CYCLOBENZAPRINE HCL 5 MG PO TABS: 5.0000 mg | ORAL_TABLET | Freq: Three times a day (TID) | ORAL | 0 refills | Status: DC | PRN

## 2022-11-28 NOTE — Patient Instructions (Signed)

## 2022-11-28 NOTE — Progress Notes (Signed)
I,[name], CMA,acting as a Neurosurgeon for Gwynneth Aliment, MD.,have documented all relevant documentation on the behalf of Gwynneth Aliment, MD,as directed by  Gwynneth Aliment, MD while in the presence of Gwynneth Aliment, MD.  Subjective:    Patient ID: Danielle Silva , female    DOB: Sep 07, 1960 , 62 y.o.   MRN: [id number]  Chief Complaint  Patient presents with   Annual Exam    HPI  She is here today for a full physical examination. She is followed by Dr. Cherly Hensen for her GYN exams. She reports compliance with meds. She denies having any headaches, chest pain and shortness of breath.   Letter sent for pap smear.  Hypertension This is a chronic problem. The current episode started more than 1 year ago. The problem has been gradually improving since onset. The problem is controlled. Pertinent negatives include no blurred vision. Risk factors for coronary artery disease include sedentary lifestyle and post-menopausal state. The current treatment provides moderate improvement. There is no history of kidney disease or CAD/MI.     Past Medical History:  Diagnosis Date   Anxiety    Complication of anesthesia    Depression    Diverticulitis    GERD (gastroesophageal reflux disease)    Hypertension    PONV (postoperative nausea and vomiting)      Family History  Problem Relation Age of Onset   Diabetes Mother    Hypertension Mother    Hyperlipidemia Mother    Heart disease Mother        CABG   Hypertension Father    Kidney disease Father        kidney transplant   Thyroid disease Father    Heart disease Father        A-fib   Arthritis Brother      Current Outpatient Medications:    Azelastine HCl 137 MCG/SPRAY SOLN, USE IN EACH NOSTRIL AS DIRECTED, Disp: 90 mL, Rfl: 2   buPROPion (WELLBUTRIN XL) 300 MG 24 hr tablet, TAKE 1 TABLET BY MOUTH EVERY DAY, Disp: 90 tablet, Rfl: 2   calcium carbonate (OS-CAL) 600 MG TABS tablet, Take 600 mg by mouth daily. , Disp: , Rfl:     cetirizine (ZYRTEC) 5 MG tablet, Take 5 mg by mouth daily., Disp: , Rfl:    Cholecalciferol (VITAMIN D3) 5000 units CAPS, Take by mouth., Disp: , Rfl:    cyclobenzaprine (FLEXERIL) 5 MG tablet, Take 1 tablet (5 mg total) by mouth 3 (three) times daily as needed for muscle spasms., Disp: 30 tablet, Rfl: 0   hydrOXYzine (VISTARIL) 25 MG capsule, TAKE 1 CAPSULE BY MOUTH EVERY 12 HOURS, Disp: 180 capsule, Rfl: 1   valsartan-hydrochlorothiazide (DIOVAN HCT) 80-12.5 MG tablet, Take 1 tablet by mouth daily., Disp: 90 tablet, Rfl: 3   zinc gluconate 50 MG tablet, Take 50 mg by mouth daily., Disp: , Rfl:    Semaglutide-Weight Management (WEGOVY) 2.4 MG/0.75ML SOAJ, Inject 2.4 mg into the skin once a week. (Patient not taking: Reported on 11/28/2022), Disp: 3 mL, Rfl: 2   Allergies  Allergen Reactions   Macrobid [Nitrofurantoin Monohydrate Macrocrystals] Hives   Nsaids Other (See Comments)    Avoids bc of colitis    Percocet [Oxycodone-Acetaminophen] Itching and Nausea And Vomiting    Can take with benadryl      The patient states she uses post menopausal status for birth control. Patient's last menstrual period was 01/05/2012.. Negative for Dysmenorrhea. Negative for: breast discharge, breast  lump(s), breast pain and breast self exam. Associated symptoms include abnormal vaginal bleeding. Pertinent negatives include abnormal bleeding (hematology), anxiety, decreased libido, depression, difficulty falling sleep, dyspareunia, history of infertility, nocturia, sexual dysfunction, sleep disturbances, urinary incontinence, urinary urgency, vaginal discharge and vaginal itching. Diet regular.The patient states her exercise level is    . The patient's tobacco use is:  Social History   Tobacco Use  Smoking Status Former   Types: Cigarettes  Smokeless Tobacco Never  . She has been exposed to passive smoke. The patient's alcohol use is:  Social History   Substance and Sexual Activity  Alcohol Use Yes    Comment: occ      Review of Systems  Constitutional: Negative.   HENT: Negative.    Eyes: Negative.  Negative for blurred vision.  Respiratory: Negative.    Cardiovascular: Negative.   Gastrointestinal: Negative.   Endocrine: Negative.   Genitourinary: Negative.   Musculoskeletal:  Positive for back pain.       She c/o low back pain. States it feels like "my back  is in a knot".  She states her sx started 3 weeks ago.  She states she was staining her deck when this started. She states she was bent over for several hours.   Described as a dull, achy pain. Also feels like she has spasms on occasion.  She feels the pain has migrated upward. No radiation to her extremities. She denies LE weakness/paresthesias.   Skin: Negative.   Allergic/Immunologic: Negative.   Neurological: Negative.   Hematological: Negative.   Psychiatric/Behavioral: Negative.       Today's Vitals   11/28/22 1559  BP: 110/70  Pulse: 77  Temp: 98 F (36.7 C)  Weight: 184 lb 3.2 oz (83.6 kg)  Height: 5' (1.524 m)  PainSc: 0-No pain   Body mass index is 35.97 kg/m.  Wt Readings from Last 3 Encounters:  11/28/22 184 lb 3.2 oz (83.6 kg)  07/19/22 180 lb 9.6 oz (81.9 kg)  05/04/22 191 lb 12.8 oz (87 kg)     Objective:  Physical Exam Vitals and nursing note reviewed.  Constitutional:      General: She is not in acute distress.    Appearance: Normal appearance. She is well-developed. She is obese.  HENT:     Head: Normocephalic and atraumatic.     Right Ear: Hearing, tympanic membrane, ear canal and external ear normal. There is no impacted cerumen.     Left Ear: Hearing, tympanic membrane, ear canal and external ear normal. There is no impacted cerumen.     Nose:     Comments: Deferred - masked    Mouth/Throat:     Comments: Deferred - masked Eyes:     General: Lids are normal.     Extraocular Movements: Extraocular movements intact.     Conjunctiva/sclera: Conjunctivae normal.     Pupils: Pupils  are equal, round, and reactive to light.     Funduscopic exam:    Right eye: No papilledema.        Left eye: No papilledema.  Neck:     Thyroid: No thyroid mass.     Vascular: No carotid bruit.  Cardiovascular:     Rate and Rhythm: Normal rate and regular rhythm.     Pulses: Normal pulses.     Heart sounds: Normal heart sounds. No murmur heard. Pulmonary:     Effort: Pulmonary effort is normal.     Breath sounds: Normal breath sounds.  Chest:  Chest wall: Mass present.  Breasts:    Tanner Score is 5.     Right: Normal. No mass or tenderness.     Left: Normal. No mass or tenderness.  Abdominal:     General: Abdomen is flat. Bowel sounds are normal. There is no distension.     Palpations: Abdomen is soft.     Tenderness: There is no abdominal tenderness.  Genitourinary:    Rectum: Guaiac result negative.  Musculoskeletal:        General: Tenderness present. No swelling. Normal range of motion.     Cervical back: Full passive range of motion without pain, normal range of motion and neck supple.     Right lower leg: No edema.     Left lower leg: No edema.     Comments: Neg straight leg test b/l  Lymphadenopathy:     Upper Body:     Right upper body: No supraclavicular, axillary or pectoral adenopathy.     Left upper body: No supraclavicular, axillary or pectoral adenopathy.  Skin:    General: Skin is warm and dry.     Capillary Refill: Capillary refill takes less than 2 seconds.  Neurological:     General: No focal deficit present.     Mental Status: She is alert and oriented to person, place, and time.     Cranial Nerves: No cranial nerve deficit.     Sensory: No sensory deficit.  Psychiatric:        Mood and Affect: Mood normal.        Behavior: Behavior normal.        Thought Content: Thought content normal.        Judgment: Judgment normal.         Assessment And Plan:     Routine general medical examination at health care facility Assessment & Plan: A  full exam was performed. I will request pap smear from her GYN, Dr. Cherly Hensen. She is encouraged to follow low sodium diet.  PATIENT IS ADVISED TO GET 30-45 MINUTES REGULAR EXERCISE NO LESS THAN FOUR TO FIVE DAYS PER WEEK - BOTH WEIGHTBEARING EXERCISES AND AEROBIC ARE RECOMMENDED.  PATIENT IS ADVISED TO FOLLOW A HEALTHY DIET WITH AT LEAST SIX FRUITS/VEGGIES PER DAY, DECREASE INTAKE OF RED MEAT, AND TO INCREASE FISH INTAKE TO TWO DAYS PER WEEK.  MEATS/FISH SHOULD NOT BE FRIED, BAKED OR BROILED IS PREFERABLE.  IT IS ALSO IMPORTANT TO CUT BACK ON YOUR SUGAR INTAKE. PLEASE AVOID ANYTHING WITH ADDED SUGAR, CORN SYRUP OR OTHER SWEETENERS. IF YOU MUST USE A SWEETENER, YOU CAN TRY STEVIA. IT IS ALSO IMPORTANT TO AVOID ARTIFICIALLY SWEETENERS AND DIET BEVERAGES. LASTLY, I SUGGEST WEARING SPF 50 SUNSCREEN ON EXPOSED PARTS AND ESPECIALLY WHEN IN THE DIRECT SUNLIGHT FOR AN EXTENDED PERIOD OF TIME.  PLEASE AVOID FAST FOOD RESTAURANTS AND INCREASE YOUR WATER INTAKE.     Orders: -     CMP14+EGFR -     CBC -     Lipid panel -     Hemoglobin A1c -     TSH  Essential hypertension, benign Assessment & Plan: Chronic, well controlled. Goal BP<130/80. She will c/w valsartan/hct 80/12.5mg  daily. I will check renal function today. She will follow up in six months for re-evaluation.   Orders: -     POCT urinalysis dipstick -     Microalbumin / creatinine urine ratio -     EKG 12-Lead  Acute bilateral low back pain without sciatica Assessment & Plan: She  was given stretching exercises to perform. She was also given rx cyclobenzaprine to use nightly prn.    Pure hypercholesterolemia Assessment & Plan: April 2023 LDL 138. She is encouraged to avoid fried foods, decrease intake of processed meats and live an active lifestyle.    Class 2 severe obesity with body mass index (BMI) of 35 to 39.9 with serious comorbidity (HCC) Assessment & Plan: BMI 35. She had great success with Wegovy; however, now her insurance is no  longer covering the medication. She is encouraged to aim for at least 150 minutes of exercise per week.    Herpes zoster vaccination declined  COVID-19 vaccination declined  Other orders -     Cyclobenzaprine HCl; Take 1 tablet (5 mg total) by mouth 3 (three) times daily as needed for muscle spasms.  Dispense: 30 tablet; Refill: 0     Return for 1 year physical, 6 month bp. Patient was given opportunity to ask questions. Patient verbalized understanding of the plan and was able to repeat key elements of the plan. All questions were answered to their satisfaction.   I, Gwynneth Aliment, MD, have reviewed all documentation for this visit. The documentation on 11/28/22 for the exam, diagnosis, procedures, and orders are all accurate and complete.

## 2022-11-29 LAB — CMP14+EGFR
ALT: 13 IU/L (ref 0–32)
AST: 20 IU/L (ref 0–40)
Albumin: 4.4 g/dL (ref 3.8–4.9)
Alkaline Phosphatase: 98 IU/L (ref 44–121)
BUN/Creatinine Ratio: 8 — ABNORMAL LOW (ref 9–23)
BUN: 7 mg/dL (ref 6–24)
Bilirubin Total: 0.5 mg/dL (ref 0.0–1.2)
CO2: 26 mmol/L (ref 20–29)
Calcium: 9.8 mg/dL (ref 8.7–10.2)
Chloride: 96 mmol/L (ref 96–106)
Creatinine, Ser: 0.87 mg/dL (ref 0.57–1.00)
Globulin, Total: 2.3 g/dL (ref 1.5–4.5)
Glucose: 82 mg/dL (ref 70–99)
Potassium: 4.4 mmol/L (ref 3.5–5.2)
Sodium: 137 mmol/L (ref 134–144)
Total Protein: 6.7 g/dL (ref 6.0–8.5)
eGFR: 77 mL/min/{1.73_m2} (ref >59)

## 2022-11-29 LAB — CBC
Hematocrit: 44.3 % (ref 34.0–46.6)
Hemoglobin: 15 g/dL (ref 11.1–15.9)
MCH: 31.3 pg (ref 26.6–33.0)
MCHC: 33.9 g/dL (ref 31.5–35.7)
MCV: 92 fL (ref 79–97)
Platelets: 402 10*3/uL (ref 150–450)
RBC: 4.8 x10E6/uL (ref 3.77–5.28)
RDW: 12.3 % (ref 11.7–15.4)
WBC: 8.1 10*3/uL (ref 3.4–10.8)

## 2022-11-29 LAB — LIPID PANEL
Chol/HDL Ratio: 3.4 ratio (ref 0.0–4.4)
Cholesterol, Total: 210 mg/dL — ABNORMAL HIGH (ref 100–199)
HDL: 61 mg/dL (ref >39)
LDL Chol Calc (NIH): 117 mg/dL — ABNORMAL HIGH (ref 0–99)
Triglycerides: 183 mg/dL — ABNORMAL HIGH (ref 0–149)
VLDL Cholesterol Cal: 32 mg/dL (ref 5–40)

## 2022-11-29 LAB — TSH: TSH: 1.83 u[IU]/mL (ref 0.450–4.500)

## 2022-11-29 LAB — MICROALBUMIN / CREATININE URINE RATIO
Creatinine, Urine: 77.8 mg/dL
Microalb/Creat Ratio: 6 mg/g creat (ref 0–29)
Microalbumin, Urine: 4.9 ug/mL

## 2022-11-29 LAB — HEMOGLOBIN A1C
Est. average glucose Bld gHb Est-mCnc: 103 mg/dL
Hgb A1c MFr Bld: 5.2 % (ref 4.8–5.6)

## 2022-12-03 DIAGNOSIS — Z0000 Encounter for general adult medical examination without abnormal findings: Secondary | ICD-10-CM | POA: Insufficient documentation

## 2022-12-03 DIAGNOSIS — G8929 Other chronic pain: Secondary | ICD-10-CM | POA: Insufficient documentation

## 2022-12-03 DIAGNOSIS — K909 Intestinal malabsorption, unspecified: Secondary | ICD-10-CM | POA: Insufficient documentation

## 2022-12-03 DIAGNOSIS — R5383 Other fatigue: Secondary | ICD-10-CM | POA: Insufficient documentation

## 2022-12-03 DIAGNOSIS — M545 Low back pain, unspecified: Secondary | ICD-10-CM | POA: Insufficient documentation

## 2022-12-03 DIAGNOSIS — K580 Irritable bowel syndrome with diarrhea: Secondary | ICD-10-CM | POA: Insufficient documentation

## 2022-12-03 DIAGNOSIS — J309 Allergic rhinitis, unspecified: Secondary | ICD-10-CM | POA: Insufficient documentation

## 2022-12-03 NOTE — Assessment & Plan Note (Signed)
BMI 35. She had great success with Wegovy; however, now her insurance is no longer covering the medication. She is encouraged to aim for at least 150 minutes of exercise per week.

## 2022-12-03 NOTE — Assessment & Plan Note (Signed)
Chronic, well controlled. Goal BP<130/80. She will c/w valsartan/hct 80/12.5mg  daily. I will check renal function today. She will follow up in six months for re-evaluation.

## 2022-12-03 NOTE — Assessment & Plan Note (Signed)
A full exam was performed. I will request pap smear from her GYN, Dr. Cherly Hensen. She is encouraged to follow low sodium diet.  PATIENT IS ADVISED TO GET 30-45 MINUTES REGULAR EXERCISE NO LESS THAN FOUR TO FIVE DAYS PER WEEK - BOTH WEIGHTBEARING EXERCISES AND AEROBIC ARE RECOMMENDED.  PATIENT IS ADVISED TO FOLLOW A HEALTHY DIET WITH AT LEAST SIX FRUITS/VEGGIES PER DAY, DECREASE INTAKE OF RED MEAT, AND TO INCREASE FISH INTAKE TO TWO DAYS PER WEEK.  MEATS/FISH SHOULD NOT BE FRIED, BAKED OR BROILED IS PREFERABLE.  IT IS ALSO IMPORTANT TO CUT BACK ON YOUR SUGAR INTAKE. PLEASE AVOID ANYTHING WITH ADDED SUGAR, CORN SYRUP OR OTHER SWEETENERS. IF YOU MUST USE A SWEETENER, YOU CAN TRY STEVIA. IT IS ALSO IMPORTANT TO AVOID ARTIFICIALLY SWEETENERS AND DIET BEVERAGES. LASTLY, I SUGGEST WEARING SPF 50 SUNSCREEN ON EXPOSED PARTS AND ESPECIALLY WHEN IN THE DIRECT SUNLIGHT FOR AN EXTENDED PERIOD OF TIME.  PLEASE AVOID FAST FOOD RESTAURANTS AND INCREASE YOUR WATER INTAKE.

## 2022-12-03 NOTE — Assessment & Plan Note (Signed)
April 2023 LDL 138. She is encouraged to avoid fried foods, decrease intake of processed meats and live an active lifestyle.

## 2022-12-03 NOTE — Assessment & Plan Note (Signed)
She was given stretching exercises to perform. She was also given rx cyclobenzaprine to use nightly prn.

## 2022-12-11 ENCOUNTER — Encounter: Payer: Self-pay | Admitting: Internal Medicine

## 2023-02-08 ENCOUNTER — Encounter: Payer: Self-pay | Admitting: Internal Medicine

## 2023-02-08 ENCOUNTER — Other Ambulatory Visit: Payer: Self-pay

## 2023-02-08 MED ORDER — WEGOVY 2.4 MG/0.75ML ~~LOC~~ SOAJ: 2.4000 mg | SUBCUTANEOUS | 2 refills | Status: DC

## 2023-02-26 ENCOUNTER — Other Ambulatory Visit: Payer: Self-pay

## 2023-02-26 ENCOUNTER — Encounter (HOSPITAL_COMMUNITY): Payer: Self-pay | Admitting: Emergency Medicine

## 2023-02-26 ENCOUNTER — Emergency Department (HOSPITAL_COMMUNITY): Payer: 59

## 2023-02-26 ENCOUNTER — Observation Stay (HOSPITAL_COMMUNITY)
Admission: EM | Admit: 2023-02-26 | Discharge: 2023-02-27 | Disposition: A | Discharge status: Home / Self Care | Payer: 59 | Attending: Internal Medicine | Admitting: Internal Medicine

## 2023-02-26 DIAGNOSIS — E669 Obesity, unspecified: Secondary | ICD-10-CM | POA: Diagnosis not present

## 2023-02-26 DIAGNOSIS — Z87891 Personal history of nicotine dependence: Secondary | ICD-10-CM | POA: Insufficient documentation

## 2023-02-26 DIAGNOSIS — R42 Dizziness and giddiness: Secondary | ICD-10-CM | POA: Diagnosis not present

## 2023-02-26 DIAGNOSIS — Z6830 Body mass index (BMI) 30.0-30.9, adult: Secondary | ICD-10-CM | POA: Insufficient documentation

## 2023-02-26 DIAGNOSIS — R110 Nausea: Secondary | ICD-10-CM | POA: Diagnosis not present

## 2023-02-26 DIAGNOSIS — I10 Essential (primary) hypertension: Secondary | ICD-10-CM | POA: Diagnosis not present

## 2023-02-26 DIAGNOSIS — R079 Chest pain, unspecified: Principal | ICD-10-CM | POA: Diagnosis present

## 2023-02-26 DIAGNOSIS — R0789 Other chest pain: Secondary | ICD-10-CM | POA: Diagnosis not present

## 2023-02-26 DIAGNOSIS — I119 Hypertensive heart disease without heart failure: Secondary | ICD-10-CM | POA: Diagnosis present

## 2023-02-26 DIAGNOSIS — F419 Anxiety disorder, unspecified: Secondary | ICD-10-CM | POA: Diagnosis present

## 2023-02-26 DIAGNOSIS — E785 Hyperlipidemia, unspecified: Secondary | ICD-10-CM | POA: Diagnosis present

## 2023-02-26 MED ORDER — MORPHINE SULFATE (PF) 4 MG/ML IV SOLN
4.0000 mg | Freq: Once | INTRAVENOUS | Status: AC
  Administered 2023-02-27: 4 mg via INTRAVENOUS
  Filled 2023-02-26: qty 1

## 2023-02-26 NOTE — ED Provider Notes (Signed)
[facility]EMERGENCY DEPT [facility] Emergency Department Provider Note MRN:  [id number]  Arrival date & time: 02/27/23     Chief Complaint   Chest Pain   History of Present Illness   Danielle Silva is a 62 y.o. year-old female presents to the ED with chief complaint of chest pain that started this morning.  Has been waxing and waning throughout the day, but has become stronger as the day has gone on.  Feels like something is twisting inside.  Has had some nausea, dizziness, and lightheadedness.  Treated with 324mg  ASA pta as well as 4mg  zofran and 1 SL nitroglycerin.  States that she initially thought it might have been GERD, but no improvement with TUMS or PPI.    Cardiac risk factors include, strong family history, smoking, HL, HTN, and BMI.  History provided by patient.   Review of Systems  Pertinent positive and negative review of systems noted in HPI.    Physical Exam   Vitals:   02/27/23 0300 02/27/23 0300  BP: 139/78   Pulse: 80   Resp: 14   Temp:  98.4 F (36.9 C)  SpO2: 100%     CONSTITUTIONAL:  well-appearing, NAD NEURO:  Alert and oriented x 3, CN 3-12 grossly intact EYES:  eyes equal and reactive ENT/NECK:  Supple, no stridor  CARDIO:  normal rate, regular rhythm, appears well-perfused  PULM:  No respiratory distress, CTAB GI/GU:  non-distended,  MSK/SPINE:  No gross deformities, no edema, moves all extremities  SKIN:  no rash, atraumatic   *Additional and/or pertinent findings included in MDM below  Diagnostic and Interventional Summary    EKG Interpretation Date/Time:  Tuesday February 27 2023 00:30:53 EDT Ventricular Rate:  74 PR Interval:  142 QRS Duration:  86 QT Interval:  388 QTC Calculation: 431 R Axis:   4  Text Interpretation: Sinus rhythm Borderline T abnormalities, anterior leads TWI in V3 new, V4 flatter than previously Confirmed by Marily Memos [phone]) on 02/27/2023 3:31:59 AM       Labs Reviewed  BASIC METABOLIC PANEL -  Abnormal; Notable for the following components:      Result Value   Glucose, Bld 101 (*)    BUN 5 (*)    All other components within normal limits  CBC  HEPATIC FUNCTION PANEL  LIPASE, BLOOD  TROPONIN I (HIGH SENSITIVITY)  TROPONIN I (HIGH SENSITIVITY)    DG Chest 2 View  Final Result      Medications  morphine (PF) 4 MG/ML injection 4 mg (4 mg Intravenous Given 02/27/23 0021)  alum & mag hydroxide-simeth (MAALOX/MYLANTA) 200-200-20 MG/5ML suspension 30 mL (30 mLs Oral Given 02/27/23 0021)  ondansetron (ZOFRAN) injection 4 mg (4 mg Intravenous Given 02/27/23 0123)  famotidine (PEPCID) IVPB 20 mg premix (0 mg Intravenous Stopped 02/27/23 0234)  morphine (PF) 4 MG/ML injection 4 mg (4 mg Intravenous Given 02/27/23 0258)     Procedures  /  Critical Care Procedures  ED Course and Medical Decision Making  I have reviewed the triage vital signs, the nursing notes, and pertinent available records from the EMR.  Social Determinants Affecting Complexity of Care: Patient has no clinically significant social determinants affecting this chief complaint..   ED Course:    Medical Decision Making Patient here with chest pain that started yesterday.  It is waxing and waning in severity.   Improved with morphine here.  Trops are flat, but EKG has new twi in v3 and flatter in v4.  Discussed with Dr.  Mesner, who suggests that patient would probably benefit from observation admission.  She does have family hx of heart disease, smoking, hx, Htn, HL.    Pain much better after second dose of morphine.  She is tachycardic or hypoxic.  I doubt PE. CXR unremarkable.  Amount and/or Complexity of Data Reviewed Labs: ordered. Radiology: ordered.  Risk OTC drugs. Prescription drug management. Decision regarding hospitalization.         Consultants: I consulted with Hospitalist, Dr. Margo Aye, who is appreciated for admitting.   Treatment and Plan: Patient's exam and diagnostic results are  concerning for chest pain.  Feel that patient will need admission to the hospital for further treatment and evaluation.  Patient discussed with attending physician, Dr. Clayborne Dana, who agrees with plan.  Final Clinical Impressions(s) / ED Diagnoses     ICD-10-CM   1. Chest pain, unspecified type  R07.9       ED Discharge Orders     None         Discharge Instructions Discussed with and Provided to Patient:   Discharge Instructions   None      Roxy Horseman, PA-C 02/27/23 0413    Mesner, Barbara Cower, MD 02/28/23 1610

## 2023-02-26 NOTE — ED Provider Triage Note (Signed)
  Emergency Medicine Provider Triage Evaluation Note  MRN:  [id number]  Arrival date & time: 02/26/23    Medically screening exam initiated at 11:40 PM.   CC:   Chest Pain   HPI:  Danielle Silva is a 62 y.o. year-old female presents to the ED with chief complaint of chest pain for the past day.  States that it feels like GERD, but hasn't been improving after tums and PPI.  States that the chest pain has been worsening.  History provided by patient ROS:  -As included in HPI PE:   Vitals:   02/26/23 2258  BP: 126/84  Pulse: 80  Resp: 17  Temp: 98.6 F (37 C)  SpO2: 98%    Non-toxic appearing No respiratory distress  MDM:  Based on signs and symptoms, ACS is highest on my differential, followed by GERD. I've ordered labs in triage to expedite lab/diagnostic workup.  Patient was informed that the remainder of the evaluation will be completed by another provider, this initial triage assessment does not replace that evaluation, and the importance of remaining in the ED until their evaluation is complete.    Roxy Horseman, PA-C 02/26/23 2341

## 2023-02-26 NOTE — ED Triage Notes (Signed)
Patient BIB EMS for evaluation of L sided chest pain.  Reports pain started earlier today. Describes pain as twisting.  Pain worsened tonight and patient began to have nausea, dizziness, and lightheadedness x 1 hour PTA.  Given ASA 324 mg P), Zofran 4 mg PO, and SL nitroglycerin x 1.  Pain and nausea has improved.

## 2023-02-27 ENCOUNTER — Observation Stay (HOSPITAL_BASED_OUTPATIENT_CLINIC_OR_DEPARTMENT_OTHER): Payer: 59

## 2023-02-27 DIAGNOSIS — R079 Chest pain, unspecified: Secondary | ICD-10-CM | POA: Diagnosis not present

## 2023-02-27 DIAGNOSIS — I10 Essential (primary) hypertension: Secondary | ICD-10-CM

## 2023-02-27 DIAGNOSIS — F419 Anxiety disorder, unspecified: Secondary | ICD-10-CM

## 2023-02-27 DIAGNOSIS — E7800 Pure hypercholesterolemia, unspecified: Secondary | ICD-10-CM

## 2023-02-27 DIAGNOSIS — R072 Precordial pain: Secondary | ICD-10-CM

## 2023-02-27 DIAGNOSIS — E669 Obesity, unspecified: Secondary | ICD-10-CM | POA: Diagnosis not present

## 2023-02-27 LAB — ECHOCARDIOGRAM COMPLETE
AR max vel: 1.93 cm2
AV Area VTI: 1.89 cm2
AV Area mean vel: 1.95 cm2
AV Mean grad: 5 mm[Hg]
AV Peak grad: 8.9 mm[Hg]
Ao pk vel: 1.49 m/s
Area-P 1/2: 3.39 cm2
Height: 62 in
S' Lateral: 2.9 cm
Weight: 2848 [oz_av]

## 2023-02-27 LAB — BASIC METABOLIC PANEL
Anion gap: 9 (ref 5–15)
BUN: 5 mg/dL — ABNORMAL LOW (ref 6–20)
CO2: 28 mmol/L (ref 22–32)
Calcium: 9.7 mg/dL (ref 8.9–10.3)
Chloride: 101 mmol/L (ref 98–111)
Creatinine, Ser: 0.74 mg/dL (ref 0.44–1.00)
GFR, Estimated: >60 mL/min (ref >60)
Glucose, Bld: 101 mg/dL — ABNORMAL HIGH (ref 70–99)
Potassium: 3.7 mmol/L (ref 3.5–5.1)
Sodium: 138 mmol/L (ref 135–145)

## 2023-02-27 LAB — HEPATIC FUNCTION PANEL
ALT: 19 U/L (ref 0–44)
AST: 23 U/L (ref 15–41)
Albumin: 3.9 g/dL (ref 3.5–5.0)
Alkaline Phosphatase: 72 U/L (ref 38–126)
Bilirubin, Direct: <0.1 mg/dL (ref 0.0–0.2)
Total Bilirubin: 0.5 mg/dL (ref 0.3–1.2)
Total Protein: 7 g/dL (ref 6.5–8.1)

## 2023-02-27 LAB — LIPASE, BLOOD: Lipase: 51 U/L (ref 11–51)

## 2023-02-27 LAB — CBC
HCT: 40.6 % (ref 36.0–46.0)
Hemoglobin: 13.8 g/dL (ref 12.0–15.0)
MCH: 30.6 pg (ref 26.0–34.0)
MCHC: 34 g/dL (ref 30.0–36.0)
MCV: 90 fL (ref 80.0–100.0)
Platelets: 343 10*3/uL (ref 150–400)
RBC: 4.51 MIL/uL (ref 3.87–5.11)
RDW: 12.4 % (ref 11.5–15.5)
WBC: 10.3 10*3/uL (ref 4.0–10.5)
nRBC: 0 % (ref 0.0–0.2)

## 2023-02-27 LAB — TROPONIN I (HIGH SENSITIVITY)
Troponin I (High Sensitivity): 4 ng/L (ref <18)
Troponin I (High Sensitivity): 2 ng/L (ref <18)

## 2023-02-27 LAB — D-DIMER, QUANTITATIVE: D-Dimer, Quant: 0.37 ug{FEU}/mL (ref 0.00–0.50)

## 2023-02-27 MED ORDER — HYDROCHLOROTHIAZIDE 12.5 MG PO TABS
12.5000 mg | ORAL_TABLET | Freq: Every day | ORAL | Status: DC
  Administered 2023-02-27: 12.5 mg via ORAL
  Filled 2023-02-27: qty 1

## 2023-02-27 MED ORDER — VALSARTAN-HYDROCHLOROTHIAZIDE 80-12.5 MG PO TABS: 1.0000 | ORAL_TABLET | Freq: Every day | ORAL | Status: DC

## 2023-02-27 MED ORDER — ASPIRIN 81 MG PO TBEC
81.0000 mg | DELAYED_RELEASE_TABLET | Freq: Every day | ORAL | Status: DC
  Administered 2023-02-27: 81 mg via ORAL
  Filled 2023-02-27: qty 1

## 2023-02-27 MED ORDER — ATORVASTATIN CALCIUM 40 MG PO TABS
40.0000 mg | ORAL_TABLET | Freq: Every day | ORAL | 11 refills | Status: DC
Start: 2023-02-27 — End: 2023-03-12

## 2023-02-27 MED ORDER — ALUM & MAG HYDROXIDE-SIMETH 200-200-20 MG/5ML PO SUSP
30.0000 mL | Freq: Once | ORAL | Status: AC
  Administered 2023-02-27: 30 mL via ORAL
  Filled 2023-02-27: qty 30

## 2023-02-27 MED ORDER — IRBESARTAN 75 MG PO TABS
75.0000 mg | ORAL_TABLET | Freq: Every day | ORAL | Status: DC
  Administered 2023-02-27: 75 mg via ORAL
  Filled 2023-02-27: qty 1

## 2023-02-27 MED ORDER — METOPROLOL TARTRATE 25 MG PO TABS
100.0000 mg | ORAL_TABLET | Freq: Once | ORAL | Status: AC
  Administered 2023-02-27: 100 mg via ORAL
  Filled 2023-02-27: qty 4

## 2023-02-27 MED ORDER — ONDANSETRON HCL 4 MG/2ML IJ SOLN
4.0000 mg | Freq: Four times a day (QID) | INTRAMUSCULAR | Status: DC | PRN
  Administered 2023-02-27 (×2): 4 mg via INTRAVENOUS
  Filled 2023-02-27 (×2): qty 2

## 2023-02-27 MED ORDER — BUPROPION HCL ER (XL) 300 MG PO TB24
300.0000 mg | ORAL_TABLET | Freq: Every day | ORAL | Status: DC
  Administered 2023-02-27: 300 mg via ORAL
  Filled 2023-02-27: qty 1

## 2023-02-27 MED ORDER — LORATADINE 10 MG PO TABS
10.0000 mg | ORAL_TABLET | Freq: Every day | ORAL | Status: DC
  Administered 2023-02-27: 10 mg via ORAL
  Filled 2023-02-27: qty 1

## 2023-02-27 MED ORDER — MORPHINE SULFATE (PF) 4 MG/ML IV SOLN
4.0000 mg | Freq: Once | INTRAVENOUS | Status: AC
  Administered 2023-02-27: 4 mg via INTRAVENOUS
  Filled 2023-02-27: qty 1

## 2023-02-27 MED ORDER — ENOXAPARIN SODIUM 40 MG/0.4ML IJ SOSY
40.0000 mg | PREFILLED_SYRINGE | Freq: Every day | INTRAMUSCULAR | Status: DC
  Filled 2023-02-27: qty 0.4

## 2023-02-27 MED ORDER — ACETAMINOPHEN 650 MG RE SUPP: 650.0000 mg | Freq: Four times a day (QID) | RECTAL | Status: DC | PRN

## 2023-02-27 MED ORDER — NITROGLYCERIN 0.4 MG SL SUBL
0.4000 mg | SUBLINGUAL_TABLET | SUBLINGUAL | Status: DC | PRN
  Administered 2023-02-27: 0.4 mg via SUBLINGUAL
  Filled 2023-02-27: qty 1

## 2023-02-27 MED ORDER — IOHEXOL 350 MG/ML SOLN
95.0000 mL | Freq: Once | INTRAVENOUS | Status: AC | PRN
  Administered 2023-02-27: 95 mL via INTRAVENOUS

## 2023-02-27 MED ORDER — ONDANSETRON HCL 4 MG/2ML IJ SOLN
4.0000 mg | Freq: Once | INTRAMUSCULAR | Status: AC
  Administered 2023-02-27: 4 mg via INTRAVENOUS
  Filled 2023-02-27: qty 2

## 2023-02-27 MED ORDER — SODIUM CHLORIDE 0.9% FLUSH: 3.0000 mL | Freq: Two times a day (BID) | INTRAVENOUS | Status: DC

## 2023-02-27 MED ORDER — ALBUTEROL SULFATE (2.5 MG/3ML) 0.083% IN NEBU: 2.5000 mg | INHALATION_SOLUTION | RESPIRATORY_TRACT | Status: DC | PRN

## 2023-02-27 MED ORDER — NITROGLYCERIN 0.4 MG SL SUBL
SUBLINGUAL_TABLET | SUBLINGUAL | Status: AC
  Filled 2023-02-27: qty 2

## 2023-02-27 MED ORDER — FAMOTIDINE IN NACL 20-0.9 MG/50ML-% IV SOLN
20.0000 mg | Freq: Once | INTRAVENOUS | Status: AC
  Administered 2023-02-27: 20 mg via INTRAVENOUS
  Filled 2023-02-27: qty 50

## 2023-02-27 MED ORDER — ONDANSETRON 4 MG PO TBDP: 4.0000 mg | ORAL_TABLET | Freq: Three times a day (TID) | ORAL | 0 refills | Status: AC | PRN

## 2023-02-27 MED ORDER — METOPROLOL TARTRATE 5 MG/5ML IV SOLN
INTRAVENOUS | Status: AC
  Filled 2023-02-27: qty 10

## 2023-02-27 MED ORDER — ONDANSETRON HCL 4 MG PO TABS: 4.0000 mg | ORAL_TABLET | Freq: Four times a day (QID) | ORAL | Status: DC | PRN

## 2023-02-27 MED ORDER — ACETAMINOPHEN 325 MG PO TABS: 650.0000 mg | ORAL_TABLET | Freq: Four times a day (QID) | ORAL | Status: DC | PRN

## 2023-02-27 NOTE — ED Notes (Signed)
Patient transported to ECHO.

## 2023-02-27 NOTE — ED Notes (Signed)
While attempting to get blood from patient, she started to vomit violently. Zofran given.

## 2023-02-27 NOTE — ED Notes (Signed)
ED TO INPATIENT HANDOFF REPORT  ED Nurse Name and Phone #: Dot Lanes, paramedic [phone]  S Name/Age/Gender Danielle Silva 62 y.o. female Room/Bed: 018C/018C  Code Status   Code Status: Full Code  Home/SNF/Other Home Patient oriented to: self, place, time, and situation Is this baseline? Yes   Triage Complete: Triage complete  Chief Complaint Chest pain [R07.9]  Triage Note Patient BIB EMS for evaluation of L sided chest pain.  Reports pain started earlier today. Describes pain as twisting.  Pain worsened tonight and patient began to have nausea, dizziness, and lightheadedness x 1 hour PTA.  Given ASA 324 mg P), Zofran 4 mg PO, and SL nitroglycerin x 1.  Pain and nausea has improved.    Allergies Allergies  Allergen Reactions   Macrobid [Nitrofurantoin Monohydrate Macrocrystals] Hives   Nsaids Other (See Comments)    Avoids bc of colitis    Percocet [Oxycodone-Acetaminophen] Itching and Nausea And Vomiting    Can take with benadryl    Level of Care/Admitting Diagnosis ED Disposition     ED Disposition  Admit   Condition  --   Comment  Hospital Area: [facility] [[id number]]  Level of Care: Telemetry Cardiac [[id number]]  May place patient in observation at [facility] or [facility] if equivalent level of care is available:: No  Covid Evaluation: Asymptomatic - no recent exposure (last 10 days) testing not required  Diagnosis: Chest pain [295621]  Admitting Physician: Darlin Drop [[id number]]  Attending Physician: Darlin Drop [[id number]]          B Medical/Surgery History Past Medical History:  Diagnosis Date   Anxiety    Complication of anesthesia    Depression    Diverticulitis    GERD (gastroesophageal reflux disease)    Hypertension    PONV (postoperative nausea and vomiting)    Past Surgical History:  Procedure Laterality Date   ANKLE FRACTURE SURGERY Left 2011   Left ankle   CESAREAN SECTION  1985   CHOLECYSTECTOMY  1985   SHOULDER  ARTHROSCOPY WITH SUBACROMIAL DECOMPRESSION Right 02/28/2021   Procedure: SHOULDER ARTHROSCOPY ROTATOR CUFF DEBRIDEMENT, SUBACROMIAL DECOMPRESSION;  Surgeon: Jones Broom, MD;  Location: [facility];  Service: Orthopedics;  Laterality: Right;   TONSILECTOMY/ADENOIDECTOMY WITH MYRINGOTOMY Bilateral 1980   TUBAL LIGATION  1985     A IV Location/Drains/Wounds Patient Lines/Drains/Airways Status     Active Line/Drains/Airways     Name Placement date Placement time Site Days   Peripheral IV 02/27/23 20 G Anterior;Proximal;Right Forearm 02/27/23  0016  Forearm  less than 1   Incision (Closed) 02/28/21 Shoulder Right 02/28/21  1601  -- 729            Intake/Output Last 24 hours  Intake/Output Summary (Last 24 hours) at 02/27/2023 0946 Last data filed at 02/27/2023 0234 Gross per 24 hour  Intake 43.13 ml  Output --  Net 43.13 ml    Labs/Imaging Results for orders placed or performed during the hospital encounter of 02/26/23 (from the past 48 hour(s))  Basic metabolic panel     Status: Abnormal   Collection Time: 02/27/23 12:16 AM  Result Value Ref Range   Sodium 138 135 - 145 mmol/L   Potassium 3.7 3.5 - 5.1 mmol/L   Chloride 101 98 - 111 mmol/L   CO2 28 22 - 32 mmol/L   Glucose, Bld 101 (H) 70 - 99 mg/dL    Comment: Glucose reference range applies only to samples taken after fasting for  at least 8 hours.   BUN 5 (L) 6 - 20 mg/dL   Creatinine, Ser 2.95 0.44 - 1.00 mg/dL   Calcium 9.7 8.9 - 28.4 mg/dL   GFR, Estimated >13 >24 mL/min    Comment: (NOTE) Calculated using the CKD-EPI Creatinine Equation (2021)    Anion gap 9 5 - 15    Comment: Performed at [facility], [address], [place], [place] [address]  CBC     Status: None   Collection Time: 02/27/23 12:16 AM  Result Value Ref Range   WBC 10.3 4.0 - 10.5 K/uL   RBC 4.51 3.87 - 5.11 MIL/uL   Hemoglobin 13.8 12.0 - 15.0 g/dL   HCT 72.5 36.6 - 44.0 %   MCV 90.0 80.0 - 100.0 fL   MCH 30.6  26.0 - 34.0 pg   MCHC 34.0 30.0 - 36.0 g/dL   RDW 34.7 42.5 - 95.6 %   Platelets 343 150 - 400 K/uL   nRBC 0.0 0.0 - 0.2 %    Comment: Performed at [facility], [address] [place], [place] [address]  Troponin I (High Sensitivity)     Status: None   Collection Time: 02/27/23 12:16 AM  Result Value Ref Range   Troponin I (High Sensitivity) 4 <18 ng/L    Comment: (NOTE) Elevated high sensitivity troponin I (hsTnI) values and significant  changes across serial measurements may suggest ACS but many other  chronic and acute conditions are known to elevate hsTnI results.  Refer to the Links section for chest pain algorithms and additional  guidance. Performed at [facility], [address] [place], [place] [address]   Hepatic function panel     Status: None   Collection Time: 02/27/23 12:16 AM  Result Value Ref Range   Total Protein 7.0 6.5 - 8.1 g/dL   Albumin 3.9 3.5 - 5.0 g/dL   AST 23 15 - 41 U/L   ALT 19 0 - 44 U/L   Alkaline Phosphatase 72 38 - 126 U/L   Total Bilirubin 0.5 0.3 - 1.2 mg/dL   Bilirubin, Direct <5.1 0.0 - 0.2 mg/dL   Indirect Bilirubin NOT CALCULATED 0.3 - 0.9 mg/dL    Comment: Performed at [facility], [address] [place], [place] [address]  Lipase, blood     Status: None   Collection Time: 02/27/23 12:16 AM  Result Value Ref Range   Lipase 51 11 - 51 U/L    Comment: Performed at [facility], [address], [place], [place] [address]  Troponin I (High Sensitivity)     Status: None   Collection Time: 02/27/23  2:02 AM  Result Value Ref Range   Troponin I (High Sensitivity) 2 <18 ng/L    Comment: (NOTE) Elevated high sensitivity troponin I (hsTnI) values and significant  changes across serial measurements may suggest ACS but many other  chronic and acute conditions are known to elevate hsTnI results.  Refer to the "Links" section for chest pain algorithms and additional  guidance. Performed at [facility],  [address], [place], [place] [address]    DG Chest 2 View  Result Date: 02/26/2023 CLINICAL DATA:  Chest pain. EXAM: CHEST - 2 VIEW COMPARISON:  Radiograph 07/05/2013 FINDINGS: The cardiomediastinal contours are normal. Chronic calcified granuloma, benign needing no further imaging follow-up. Pulmonary vasculature is normal. No consolidation, pleural effusion, or pneumothorax. No acute osseous abnormalities are seen. IMPRESSION: No acute  chest findings. Electronically Signed   By: Narda Rutherford M.D.   On: 02/26/2023 23:40    Pending Labs Unresulted Labs (From admission, onward)     Start     Ordered   02/28/23 0500  CBC  Tomorrow morning,   R        02/27/23 0741   02/28/23 0500  Basic metabolic panel  Tomorrow morning,   R        02/27/23 0741   02/27/23 0940  HIV Antibody (routine testing w rflx)  Once,   R        02/27/23 0940   02/27/23 0940  Differential  Once,   AD        02/27/23 0940   02/27/23 0744  D-dimer, quantitative  Once,   R        02/27/23 0744   02/27/23 0741  TSH  Once,   R        02/27/23 0741            Vitals/Pain Today's Vitals   02/27/23 0600 02/27/23 0643 02/27/23 0845 02/27/23 0913  BP: 120/81  127/82   Pulse: 74  79   Resp: 18  16   Temp:  98.4 F (36.9 C) 98 F (36.7 C)   TempSrc:  Oral Oral   SpO2: 100%  100%   Weight:      Height:      PainSc:   3  0-No pain    Isolation Precautions No active isolations  Medications Medications  enoxaparin (LOVENOX) injection 40 mg (has no administration in time range)  sodium chloride flush (NS) 0.9 % injection 3 mL (has no administration in time range)  acetaminophen (TYLENOL) tablet 650 mg (has no administration in time range)    Or  acetaminophen (TYLENOL) suppository 650 mg (has no administration in time range)  ondansetron (ZOFRAN) tablet 4 mg ( Oral See Alternative 02/27/23 0909)    Or  ondansetron (ZOFRAN) injection 4 mg (4 mg Intravenous Given 02/27/23 0909)  albuterol (PROVENTIL) (2.5  MG/3ML) 0.083% nebulizer solution 2.5 mg (has no administration in time range)  nitroGLYCERIN (NITROSTAT) SL tablet 0.4 mg (0.4 mg Sublingual Given 02/27/23 0900)  buPROPion (WELLBUTRIN XL) 24 hr tablet 300 mg (has no administration in time range)  loratadine (CLARITIN) tablet 10 mg (has no administration in time range)  irbesartan (AVAPRO) tablet 75 mg (has no administration in time range)    And  hydrochlorothiazide (HYDRODIURIL) tablet 12.5 mg (has no administration in time range)  morphine (PF) 4 MG/ML injection 4 mg (4 mg Intravenous Given 02/27/23 0021)  alum & mag hydroxide-simeth (MAALOX/MYLANTA) 200-200-20 MG/5ML suspension 30 mL (30 mLs Oral Given 02/27/23 0021)  ondansetron (ZOFRAN) injection 4 mg (4 mg Intravenous Given 02/27/23 0123)  famotidine (PEPCID) IVPB 20 mg premix (0 mg Intravenous Stopped 02/27/23 0234)  morphine (PF) 4 MG/ML injection 4 mg (4 mg Intravenous Given 02/27/23 0258)    Mobility walks     Focused Assessments    R Recommendations: See Admitting Provider Note  Report given to:   Additional Notes:

## 2023-02-27 NOTE — Consult Note (Signed)
Cardiology Consultation   Patient ID: Danielle Silva MRN: [id number]; DOB: October 26, 1960  Admit date: 02/26/2023 Date of Consult: 02/27/2023  PCP:  Dorothyann Peng, MD   [facility] Providers Cardiologist:  None        Patient Profile:   Danielle Silva is a 62 y.o. female with a hx of hypertension, GERD, anxiety, depression, obesity (now on GLP-1) who is being seen 02/27/2023 for the evaluation of chest pain at the request of Dr. Katrinka Blazing.  History of Present Illness:   [name] presented to the emergency department via EMS for evaluation of chest pain.  Patient says that around 10 AM on 10/7, she developed "gnawing" chest discomfort under her left breast with radiation into her back that she initially believed was an atypical manifestation of her gastroesophageal reflux disease.  Discomfort rated at a 6 out of 10 in severity.  She took two 20 mg Nexium and Tums but did not have a significant change in her symptoms.  Patient says that the pain seemed to gradually improve through the early afternoon, but then later in the day she had a recurrence of symptoms as well as some nausea.  Patient again took Nexium and Tums and also drank a carbonated beverage thinking that perhaps she was bloated and needed to burp.  She continued with discomfort into the late evening and when pain crescendoed at a 10 out of 10 in severity, she called EMS.  Patient took aspirin prior to arrival of EMS and did receive nitroglycerin on the way to the hospital. By the time she arrived to the emergency department, pain improved to a 4 out of 10.  Since arriving to the ED, patient reports that pain has never fully resolved but is significantly better today. No specific provocation or alleviating factors. She did have an episode of emesis this morning and feels that current discomfort may be a result of having not eaten.  Patient continues to describe her pain as a gnawing sensation, says that it does feel like her  reflux symptoms but the location is different from where she has felt it before.  Aside from atypical chest discomfort yesterday, patient has not had any recent chest pain or shortness of breath.  She reports stable exertional tolerance and also denies orthopnea or lower extremity edema.  Denies recent illness.  She is adherent to her blood pressure regimen at home and reports that her typical home readings are less than 130/80.  Regarding cardiac history, patient reports a remote history of a stress test.  She says that this test was not fully completed because she developed an asthma attack in the middle of the test.  Otherwise has never had ischemic evaluation.  Reports that her mother was diagnosed with coronary artery disease and had coronary bypass in her late 12s.   Past Medical History:  Diagnosis Date   Anxiety    Complication of anesthesia    Depression    Diverticulitis    GERD (gastroesophageal reflux disease)    Hypertension    PONV (postoperative nausea and vomiting)     Past Surgical History:  Procedure Laterality Date   ANKLE FRACTURE SURGERY Left 2011   Left ankle   CESAREAN SECTION  1985   CHOLECYSTECTOMY  1985   SHOULDER ARTHROSCOPY WITH SUBACROMIAL DECOMPRESSION Right 02/28/2021   Procedure: SHOULDER ARTHROSCOPY ROTATOR CUFF DEBRIDEMENT, SUBACROMIAL DECOMPRESSION;  Surgeon: Jones Broom, MD;  Location: [facility];  Service: Orthopedics;  Laterality: Right;  TONSILECTOMY/ADENOIDECTOMY WITH MYRINGOTOMY Bilateral 1980   TUBAL LIGATION  1985     Home Medications:  Prior to Admission medications   Medication Sig Start Date End Date Taking? Authorizing Provider  buPROPion (WELLBUTRIN XL) 300 MG 24 hr tablet TAKE 1 TABLET BY MOUTH EVERY DAY 11/13/22  Yes Dorothyann Peng, MD  cetirizine (ZYRTEC) 5 MG tablet Take 5 mg by mouth daily.   Yes [provider]  Cyanocobalamin (VITAMIN B-12 PO) Take 1 capsule by mouth daily.   Yes [provider]  MAGNESIUM PO Take 1 capsule by mouth daily.   Yes [provider]  Semaglutide-Weight Management (WEGOVY) 2.4 MG/0.75ML SOAJ Inject 2.4 mg into the skin once a week. 02/08/23  Yes Dorothyann Peng, MD  valsartan-hydrochlorothiazide (DIOVAN HCT) 80-12.5 MG tablet Take 1 tablet by mouth daily. 07/19/22 07/19/23 Yes Dorothyann Peng, MD  VITAMIN D PO Take 1 capsule by mouth daily.   Yes [provider]  Azelastine HCl 137 MCG/SPRAY SOLN USE IN EACH NOSTRIL AS DIRECTED Patient not taking: Reported on 02/27/2023 05/29/22   Dorothyann Peng, MD  cyclobenzaprine (FLEXERIL) 5 MG tablet Take 1 tablet (5 mg total) by mouth 3 (three) times daily as needed for muscle spasms. Patient not taking: Reported on 02/27/2023 11/28/22   Dorothyann Peng, MD    Inpatient Medications: Scheduled Meds:  buPROPion  300 mg Oral Daily   enoxaparin (LOVENOX) injection  40 mg Subcutaneous Daily   irbesartan  75 mg Oral Daily   And   hydrochlorothiazide  12.5 mg Oral Daily   loratadine  10 mg Oral Daily   metoprolol tartrate  100 mg Oral Once   sodium chloride flush  3 mL Intravenous Q12H   Continuous Infusions:  PRN Meds: acetaminophen **OR** acetaminophen, albuterol, nitroGLYCERIN, ondansetron **OR** ondansetron (ZOFRAN) IV  Allergies:    Allergies  Allergen Reactions   Macrobid [Nitrofurantoin Monohydrate Macrocrystals] Hives   Nsaids Other (See Comments)    Avoids bc of colitis    Percocet [Oxycodone-Acetaminophen] Itching and Nausea And Vomiting    Can take with benadryl    Social History:   Social History   Socioeconomic History   Marital status: Married    Spouse name: Not on file   Number of children: Not on file   Years of education: Not on file   Highest education level: Not on file  Occupational History   Not on file  Tobacco Use   Smoking status: Former    Types: Cigarettes   Smokeless tobacco: Never  Vaping Use   Vaping status: Never Used  Substance and Sexual  Activity   Alcohol use: Yes    Comment: occ   Drug use: No   Sexual activity: Yes    Partners: Male  Other Topics Concern   Not on file  Social History Narrative   Married: 3 kids: 58 yr old twins and 33 yr old son   Work: [employer]      Regular exercise: Pure bar 4 times a week/yoga, pilates and ballet   Caffeine use: 2 cups of coffee in the AM         Social Determinants of Health   Financial Resource Strain: Not on file  Food Insecurity: Not on file  Transportation Needs: Not on file  Physical Activity: Not on file  Stress: Not on file  Social Connections: Not on file  Intimate Partner Violence: Not on file    Family History:    Family History  Problem Relation Age of Onset   Diabetes Mother    Hypertension Mother    Hyperlipidemia Mother    Heart disease Mother        CABG   Hypertension Father    Kidney disease Father        kidney transplant   Thyroid disease Father    Heart disease Father        A-fib   Arthritis Brother      ROS:  Please see the history of present illness.   All other ROS reviewed and negative.     Physical Exam/Data:   Vitals:   02/27/23 0600 02/27/23 0643 02/27/23 0845 02/27/23 0945  BP: 120/81  127/82 116/79  Pulse: 74  79 73  Resp: 18  16 16   Temp:  98.4 F (36.9 C) 98 F (36.7 C)   TempSrc:  Oral Oral   SpO2: 100%  100% 99%  Weight:      Height:        Intake/Output Summary (Last 24 hours) at 02/27/2023 1022 Last data filed at 02/27/2023 0234 Gross per 24 hour  Intake 43.13 ml  Output --  Net 43.13 ml      02/26/2023   11:04 PM 11/28/2022    3:59 PM 07/19/2022    2:28 PM  Last 3 Weights  Weight (lbs) 178 lb 184 lb 3.2 oz 180 lb 9.6 oz  Weight (kg) 80.74 kg 83.553 kg 81.92 kg     Body mass index is 32.56 kg/m.  General:  Well nourished, well developed, in no acute distress HEENT: normal Neck: no JVD Vascular: No carotid bruits; Distal pulses 2+ bilaterally Cardiac:  normal S1, S2; RRR;  no murmur  Lungs:  clear to auscultation bilaterally, no wheezing, rhonchi or rales  Abd: soft, nontender, no hepatomegaly  Ext: no edema Musculoskeletal:  No deformities, BUE and BLE strength normal and equal Skin: warm and dry  Neuro:  CNs 2-12 intact, no focal abnormalities noted Psych:  Normal affect   EKG:  The EKG was personally reviewed and demonstrates: Sinus rhythm with nonspecific T wave inversion in leads V3 and V4. Telemetry:  Telemetry was personally reviewed and demonstrates: Sinus rhythm  Relevant CV Studies:  No prior cardiac studies available  Laboratory Data:  High Sensitivity Troponin:   Recent Labs  Lab 02/27/23 0016 02/27/23 0202  TROPONINIHS 4 2     Chemistry Recent Labs  Lab 02/27/23 0016  NA 138  K 3.7  CL 101  CO2 28  GLUCOSE 101*  BUN 5*  CREATININE 0.74  CALCIUM 9.7  GFRNONAA >60  ANIONGAP 9    Recent Labs  Lab 02/27/23 0016  PROT 7.0  ALBUMIN 3.9  AST 23  ALT 19  ALKPHOS 72  BILITOT 0.5   Lipids No results for input(s): "CHOL", "TRIG", "HDL", "LABVLDL", "LDLCALC", "CHOLHDL" in the last 168 hours.  Hematology Recent Labs  Lab 02/27/23 0016  WBC 10.3  RBC 4.51  HGB 13.8  HCT 40.6  MCV 90.0  MCH 30.6  MCHC 34.0  RDW 12.4  PLT 343   Thyroid No results for input(s): "TSH", "FREET4" in the last 168 hours.  BNPNo results for input(s): "BNP", "PROBNP" in the last 168 hours.  DDimer  Recent Labs  Lab 02/27/23 0853  DDIMER 0.37     Radiology/Studies:  DG Chest 2 View  Result Date: 02/26/2023 CLINICAL DATA:  Chest pain. EXAM: CHEST - 2 VIEW COMPARISON:  Radiograph 07/05/2013 FINDINGS: The cardiomediastinal contours are  normal. Chronic calcified granuloma, benign needing no further imaging follow-up. Pulmonary vasculature is normal. No consolidation, pleural effusion, or pneumothorax. No acute osseous abnormalities are seen. IMPRESSION: No acute chest findings. Electronically Signed   By: Narda Rutherford M.D.   On:  02/26/2023 23:40     Assessment and Plan:   Atypical chest pain  Patient presented to the emergency department for evaluation after developing atypical chest discomfort yesterday that initially was waxing and waning but later became persistent.  This morning reports only mild discomfort under her left breast, does not appear in any acute distress.  High-sensitivity troponin negative x 2.  ECG without acute ischemic changes, nonspecific T wave inversion in V3 and V4. Given that patient has been admitted by medicine team and has family history of CAD, will proceed with cardiac CTA. With negative troponin, unlikely to find obstructive disease. Metoprolol tartrate 100 mg x 1 dose prior to CTA for heart rate optimization. ASA recommendation to be determined by CT findings. Echocardiogram has been ordered by primary team, cardiology will follow  Hypertension  Per patient, BP generally well controlled under 130/80 with Valsartan/hydrochlorothiazide 80-12.5mg . Would continue with this inpatient.   Hyperlipidemia  July lipid panel as below.  Will repeat this morning given that patient has been held n.p.o. If LDL remains above 100, would recommend starting statin therapy.  Will also check lipoprotein a given family history of coronary artery disease.  Lab Results  Component Value Date   CHOL 210 (H) 11/28/2022   HDL 61 11/28/2022   LDLCALC 117 (H) 11/28/2022   TRIG 183 (H) 11/28/2022   CHOLHDL 3.4 11/28/2022     Risk Assessment/Risk Scores:                For questions or updates, please contact  [facility] Please consult [email or web address] for contact info under    Signed, Perlie Gold, PA-C  02/27/2023 10:22 AM

## 2023-02-27 NOTE — ED Notes (Signed)
Patient ambulated to the restroom independently  

## 2023-02-27 NOTE — ED Notes (Signed)
Patient transported to CT 

## 2023-02-27 NOTE — ED Notes (Signed)
Admitting MD at bedside.

## 2023-02-27 NOTE — H&P (Signed)
History and Physical    Patient: Danielle Silva:[id number] DOB: 1960/10/28 DOA: 02/26/2023 DOS: the patient was seen and examined on 02/27/2023 PCP: Dorothyann Peng, MD  Patient coming from: Home via EMS  Chief Complaint:  Chief Complaint  Patient presents with   Chest Pain   HPI: Danielle Silva is a 62 y.o. female with medical history significant of hypertension, anxiety, depression, obesity, and GERD who presents with complaints of chest pain.  Symptoms started yesterday morning around 10 AM prior to the patient eating anything.  She describes it as a sharp and twisting pain underneath her left breast with radiation to her shoulder blade.  She initially thought symptoms were secondary to indigestion and taken Nexium and Tums without improvement.  Pain waxed and waned throughout the day.  She tried belching and again took Nexium and Tums without improvement in her symptoms.  Later on that evening patient reported symptoms became severe 10 out of 10 on the pain scale.  Noted associated symptoms of nausea, dry heaves, and lightheadedness.  Denied having any fever, cough, recent heavy lifting, falls, leg swelling, or calf pain.  She works as a Financial risk analyst, but reports getting up intermittently throughout the day.  She has a remote 8 smoking pack year history. Patient had taken full dose aspirin prior to EMS arrival.  En route with EMS she had been given nitroglycerin and reported pain level went from a 10/10 down to a 4 on the pain scale.   In the emergency department patient was noted to be afebrile with mild tachypnea, but all other vital signs were within normal limits.  EKG did not show any significant ischemic changes.  Labs were relatively unremarkable with two negative high-sensitivity troponins. Chest x-ray was negative.  Patient had been given morphine IV, Zofran IV, Famotidine IV, and Maalox/Mylanta.  Review of Systems: As mentioned in the history of present illness. All other  systems reviewed and are negative. Past Medical History:  Diagnosis Date   Anxiety    Complication of anesthesia    Depression    Diverticulitis    GERD (gastroesophageal reflux disease)    Hypertension    PONV (postoperative nausea and vomiting)    Past Surgical History:  Procedure Laterality Date   ANKLE FRACTURE SURGERY Left 2011   Left ankle   CESAREAN SECTION  1985   CHOLECYSTECTOMY  1985   SHOULDER ARTHROSCOPY WITH SUBACROMIAL DECOMPRESSION Right 02/28/2021   Procedure: SHOULDER ARTHROSCOPY ROTATOR CUFF DEBRIDEMENT, SUBACROMIAL DECOMPRESSION;  Surgeon: Jones Broom, MD;  Location: [facility];  Service: Orthopedics;  Laterality: Right;   TONSILECTOMY/ADENOIDECTOMY WITH MYRINGOTOMY Bilateral 1980   TUBAL LIGATION  1985   Social History:  reports that she has quit smoking. Her smoking use included cigarettes. She has never used smokeless tobacco. She reports current alcohol use. She reports that she does not use drugs.  Allergies  Allergen Reactions   Macrobid [Nitrofurantoin Monohydrate Macrocrystals] Hives   Nsaids Other (See Comments)    Avoids bc of colitis    Percocet [Oxycodone-Acetaminophen] Itching and Nausea And Vomiting    Can take with benadryl    Family History  Problem Relation Age of Onset   Diabetes Mother    Hypertension Mother    Hyperlipidemia Mother    Heart disease Mother        CABG   Hypertension Father    Kidney disease Father        kidney transplant   Thyroid disease Father  Heart disease Father        A-fib   Arthritis Brother     Prior to Admission medications   Medication Sig Start Date End Date Taking? Authorizing Provider  Azelastine HCl 137 MCG/SPRAY SOLN USE IN EACH NOSTRIL AS DIRECTED 05/29/22   Dorothyann Peng, MD  buPROPion (WELLBUTRIN XL) 300 MG 24 hr tablet TAKE 1 TABLET BY MOUTH EVERY DAY 11/13/22   Dorothyann Peng, MD  calcium carbonate (OS-CAL) 600 MG TABS tablet Take 600 mg by mouth daily.     [provider]  cetirizine (ZYRTEC) 5 MG tablet Take 5 mg by mouth daily.    [provider]  Cholecalciferol (VITAMIN D3) 5000 units CAPS Take by mouth.    [provider]  cyclobenzaprine (FLEXERIL) 5 MG tablet Take 1 tablet (5 mg total) by mouth 3 (three) times daily as needed for muscle spasms. 11/28/22   Dorothyann Peng, MD  hydrOXYzine (VISTARIL) 25 MG capsule TAKE 1 CAPSULE BY MOUTH EVERY 12 HOURS 01/11/22   Dorothyann Peng, MD  Semaglutide-Weight Management [facility]) 2.4 MG/0.75ML SOAJ Inject 2.4 mg into the skin once a week. 02/08/23   Dorothyann Peng, MD  valsartan-hydrochlorothiazide (DIOVAN HCT) 80-12.5 MG tablet Take 1 tablet by mouth daily. 07/19/22 07/19/23  Dorothyann Peng, MD  zinc gluconate 50 MG tablet Take 50 mg by mouth daily.    [provider]    Physical Exam: Vitals:   02/27/23 0500 02/27/23 0530 02/27/23 0600 02/27/23 0643  BP: 113/80 105/74 120/81   Pulse: 72 74 74   Resp: 18 15 18    Temp:    98.4 F (36.9 C)  TempSrc:    Oral  SpO2: 93% 95% 100%   Weight:      Height:       Constitutional: Middle-aged female currently NAD, calm, comfortable Eyes: PERRL, lids and conjunctivae normal ENMT: Mucous membranes are moist. Posterior pharynx clear of any exudate or lesions.  Neck: normal, supple,  Respiratory: clear to auscultation bilaterally, no wheezing, no crackles. Normal respiratory effort. No accessory muscle use.  Cardiovascular: Regular rate and rhythm, no murmurs / rubs / gallops. No significant lower extremity edema.   Musculoskeletal: no clubbing / cyanosis. No joint deformity upper and lower extremities. Good ROM, no contractures. Normal muscle tone.  Skin: no rashes, lesions, ulcers. No induration Neurologic: CN 2-12 grossly intact.   Strength 5/5 in all 4.  Psychiatric: Normal judgment and insight. Alert and oriented x 3. Normal mood.   Data Reviewed:  Sinus rhythm at 74 bpm with borderline T waves in the anterior leads.  Reviewed  labs, imaging, and pertinent records as documented in this note.  Assessment and Plan: Chest pain Patient presented with complaints of chest pain starting yesterday morning.  Reported significant improvement with nitroglycerin.  High-sensitivity troponins were negative x 2.  EKG without significant ischemic changes.  Chest x-ray showed no acute abnormality.  Patient has significant family history of cardiac disease in her mother heart open heart surgery near her age. -Admit to a cardiac telemetry bed -Check D-dimer -Check echocardiogram -Cardiology consulted to evaluate, for possible need of stress test versus cardiac CT  Essential hypertension Blood pressures are currently maintained. -Continue pharmacy substitution of valsartan-hydrochlorothiazide  Hyperlipidemia Patient had recent lipid panel done on 11/28/2022  noted total cholesterol 210, HDL 60, triglycerides 183, and LDL 117. -Start statin at patient agrees  Anxiety -Continue Wellbutrin  Obesity BMI 32.56 kg/m.  Patient on [facility] for weight loss. -Continue [id number] in outpatient  setting  DVT prophylaxis: Lovenox Advance Care Planning:   Code Status: Full Code   Consults: Cardiology  Family Communication: Husband updated at bedside  Severity of Illness: The appropriate patient status for this patient is OBSERVATION. Observation status is judged to be reasonable and necessary in order to provide the required intensity of service to ensure the patient's safety. The patient's presenting symptoms, physical exam findings, and initial radiographic and laboratory data in the context of their medical condition is felt to place them at decreased risk for further clinical deterioration. Furthermore, it is anticipated that the patient will be medically stable for discharge from the hospital within 2 midnights of admission.   Author: Clydie Braun, MD 02/27/2023 7:26 AM  For on call review [email or web address].

## 2023-02-28 NOTE — Discharge Summary (Signed)
Physician Discharge Summary   Patient: Danielle Silva MRN: [id number] DOB: 25-May-1960  Admit date:     02/26/2023  Discharge date: 02/27/2023  Discharge Physician: Clydie Braun   PCP: Dorothyann Peng, MD   Recommendations at discharge:   Please follow-up with primary care provider in regards to possible need of referral for GI  Discharge Diagnoses: Principal Problem:   Chest pain Active Problems:   Hyperlipidemia   Essential hypertension, benign   Anxiety   Obesity (BMI 30-39.9)  Resolved Problems:   * No resolved hospital problems. *  Hospital Course: Danielle Silva is a 62 y.o. female with medical history significant of hypertension, anxiety, depression, obesity, and GERD who presents with complaints of chest pain.  Symptoms started yesterday morning around 10 AM prior to the patient eating anything.  She describes it as a sharp and twisting pain underneath her left breast with radiation to her shoulder blade.  She initially thought symptoms were secondary to indigestion and taken Nexium and Tums without improvement.  Pain waxed and waned throughout the day.  She tried belching and again took Nexium and Tums without improvement in her symptoms.  Later on that evening patient reported symptoms became severe 10 out of 10 on the pain scale.  Noted associated symptoms of nausea, dry heaves, and lightheadedness.  Denied having any fever, cough, recent heavy lifting, falls, leg swelling, or calf pain.  She works as a Financial risk analyst, but reports getting up intermittently throughout the day.  She has a remote 8 smoking pack year history. Patient had taken full dose aspirin prior to EMS arrival.   En route with EMS she had been given nitroglycerin and reported pain level went from a 10/10 down to a 4 on the pain scale.     In the emergency department patient was noted to be afebrile with mild tachypnea, but all other vital signs were within normal limits.  EKG did not show any  significant ischemic changes.  Labs were relatively unremarkable with two negative high-sensitivity troponins. Chest x-ray was negative.  Patient had been given morphine IV, Zofran IV, Famotidine IV, and Maalox/Mylanta.    Assessment and Plan:  Chest pain Patient presented with complaints of chest pain starting yesterday morning.  Reported significant improvement with nitroglycerin.  High-sensitivity troponins were negative x 2.  EKG without significant ischemic changes.  Chest x-ray showed no acute abnormality.  Patient has significant family history of cardiac disease in her mother who had heart open heart surgery near her age.  Echocardiogram noted EF to be 60 to 65% with grade 1 diastolic dysfunction.  Cardiology had been formally consulted and patient underwent coronary CTA which noted low risk with nonobstructive coronary disease.  Symptoms were thought possibly GI in nature.  Patient was started on aspirin and statin    Essential hypertension Blood pressures were currently maintained. -Continue valsartan-hydrochlorothiazide   Hyperlipidemia Patient had recent lipid panel done on 11/28/2022  noted total cholesterol 210, HDL 60, triglycerides 183, and LDL 117. -Started atorvastatin 40 mg daily   Anxiety -Continue Wellbutrin   Obesity BMI 32.56 kg/m.  Patient on [facility] for weight loss. Raquel James in outpatient setting        Consultants: Cardiology Procedures performed: Coronary CT Disposition: Home Diet recommendation:  Discharge Diet Orders (From admission, onward)     Start     Ordered   02/27/23 0000  Diet - low sodium heart healthy        02/27/23 1613  Cardiac diet DISCHARGE MEDICATION: Allergies as of 02/27/2023       Reactions   Macrobid [nitrofurantoin Monohydrate Macrocrystals] Hives   Nsaids Other (See Comments)   Avoids bc of colitis   Percocet [oxycodone-acetaminophen] Itching, Nausea And Vomiting   Can take with benadryl         Medication List     STOP taking these medications    Azelastine HCl 137 MCG/SPRAY Soln   cyclobenzaprine 5 MG tablet Commonly known as: FLEXERIL       TAKE these medications    atorvastatin 40 MG tablet Commonly known as: Lipitor Take 1 tablet (40 mg total) by mouth daily.   buPROPion 300 MG 24 hr tablet Commonly known as: WELLBUTRIN XL TAKE 1 TABLET BY MOUTH EVERY DAY   cetirizine 5 MG tablet Commonly known as: ZYRTEC Take 5 mg by mouth daily.   MAGNESIUM PO Take 1 capsule by mouth daily.   ondansetron 4 MG disintegrating tablet Commonly known as: ZOFRAN-ODT Take 1 tablet (4 mg total) by mouth every 8 (eight) hours as needed for nausea or vomiting.   valsartan-hydrochlorothiazide 80-12.5 MG tablet Commonly known as: Diovan HCT Take 1 tablet by mouth daily.   VITAMIN B-12 PO Take 1 capsule by mouth daily.   VITAMIN D PO Take 1 capsule by mouth daily.   Wegovy 2.4 MG/0.75ML Soaj Generic drug: Semaglutide-Weight Management Inject 2.4 mg into the skin once a week.        Discharge Exam: Filed Weights   02/26/23 2304  Weight: 80.7 kg    Constitutional: Middle-age female who appears to be uncomfortable, but able to Eyes: PERRL, lids and conjunctivae normal ENMT: Mucous membranes are moist. Respiratory: clear to auscultation bilaterally, no wheezing, no crackles. Normal respiratory effort.  Cardiovascular: Regular rate and rhythm, no murmurs / rubs / gallops. No extremity edema. 2+ pedal pulses.  Abdomen: no tenderness, no masses palpated. Bowel sounds positive.  Musculoskeletal: no clubbing / cyanosis. No joint deformity upper and lower extremities. Good ROM, no contractures. Normal muscle tone.  Skin: no rashes, lesions, ulcers. No induration Neurologic: CN 2-12 grossly intact. Strength 5/5 in all 4.  Psychiatric: Normal judgment and insight. Alert and oriented x 3. Normal mood.    Condition at discharge: fair  The results of significant  diagnostics from this hospitalization (including imaging, microbiology, ancillary and laboratory) are listed below for reference.   Imaging Studies: ECHOCARDIOGRAM COMPLETE  Result Date: 02/27/2023    ECHOCARDIOGRAM REPORT   Patient Name:   Danielle Silva Date of Exam: 02/27/2023 Medical Rec #:  [id number]      Height:       62.0 in Accession #:    [id number]     Weight:       178.0 lb Date of Birth:  [birth year]     BSA:          1.819 m Patient Age:    61 years       BP:           127/82 mmHg Patient Gender: F              HR:           68 bpm. Exam Location:  Inpatient Procedure: 2D Echo, Color Doppler and Cardiac Doppler Indications:    Chest Pain  History:        Patient has no prior history of Echocardiogram examinations.  Signs/Symptoms:Chest Pain; Risk Factors:Hypertension,                 Dyslipidemia and Obesity.  Sonographer:    Milbert Coulter Referring Phys: [id number] Sontee Desena A Dream Nodal IMPRESSIONS  1. Left ventricular ejection fraction, by estimation, is 60 to 65%. The left ventricle has normal function. The left ventricle has no regional wall motion abnormalities. Left ventricular diastolic parameters are consistent with Grade I diastolic dysfunction (impaired relaxation).  2. Right ventricular systolic function is normal. The right ventricular size is normal.  3. The mitral valve is myxomatous. Mild mitral valve regurgitation. No evidence of mitral stenosis.  4. Tricuspid valve regurgitation is mild to moderate.  5. The aortic valve is normal in structure. Aortic valve regurgitation is not visualized. No aortic stenosis is present.  6. The inferior vena cava is normal in size with greater than 50% respiratory variability, suggesting right atrial pressure of 3 mmHg. FINDINGS  Left Ventricle: Left ventricular ejection fraction, by estimation, is 60 to 65%. The left ventricle has normal function. The left ventricle has no regional wall motion abnormalities. The left ventricular internal cavity  size was normal in size. There is  no left ventricular hypertrophy. Left ventricular diastolic parameters are consistent with Grade I diastolic dysfunction (impaired relaxation). Right Ventricle: The right ventricular size is normal. No increase in right ventricular wall thickness. Right ventricular systolic function is normal. Left Atrium: Left atrial size was normal in size. Right Atrium: Right atrial size was normal in size. Pericardium: There is no evidence of pericardial effusion. Mitral Valve: The mitral valve is myxomatous. Mild mitral valve regurgitation. No evidence of mitral valve stenosis. Tricuspid Valve: The tricuspid valve is normal in structure. Tricuspid valve regurgitation is mild to moderate. No evidence of tricuspid stenosis. Aortic Valve: The aortic valve is normal in structure. Aortic valve regurgitation is not visualized. No aortic stenosis is present. Aortic valve mean gradient measures 5.0 mmHg. Aortic valve peak gradient measures 8.9 mmHg. Aortic valve area, by VTI measures 1.89 cm. Pulmonic Valve: The pulmonic valve was normal in structure. Pulmonic valve regurgitation is not visualized. No evidence of pulmonic stenosis. Aorta: The aortic root is normal in size and structure. Venous: The inferior vena cava is normal in size with greater than 50% respiratory variability, suggesting right atrial pressure of 3 mmHg. IAS/Shunts: No atrial level shunt detected by color flow Doppler.  LEFT VENTRICLE PLAX 2D LVIDd:         4.50 cm   Diastology LVIDs:         2.90 cm   LV e' medial:    9.36 cm/s LV PW:         0.80 cm   LV E/e' medial:  8.1 LV IVS:        0.80 cm   LV e' lateral:   14.30 cm/s LVOT diam:     1.80 cm   LV E/e' lateral: 5.3 LV SV:         63 LV SV Index:   34 LVOT Area:     2.54 cm  RIGHT VENTRICLE RV Basal diam:  2.60 cm RV Mid diam:    2.20 cm RV S prime:     13.10 cm/s TAPSE (M-mode): 1.9 cm LEFT ATRIUM             Index        RIGHT ATRIUM           Index LA diam:  3.80 cm  2.09 cm/m   RA Area:     16.20 cm LA Vol (A2C):   26.0 ml 14.29 ml/m  RA Volume:   41.60 ml  22.87 ml/m LA Vol (A4C):   36.4 ml 20.01 ml/m LA Biplane Vol: 31.1 ml 17.09 ml/m  AORTIC VALVE                    PULMONIC VALVE AV Area (Vmax):    1.93 cm     PV Vmax:        1.34 m/s AV Area (Vmean):   1.95 cm     PV Vmean:       97.400 cm/s AV Area (VTI):     1.89 cm     PV VTI:         0.305 m AV Vmax:           149.00 cm/s  PV Peak grad:   7.2 mmHg AV Vmean:          99.700 cm/s  PV Mean grad:   4.0 mmHg AV VTI:            0.331 m      RVOT Peak grad: 4 mmHg AV Peak Grad:      8.9 mmHg AV Mean Grad:      5.0 mmHg LVOT Vmax:         113.00 cm/s LVOT Vmean:        76.500 cm/s LVOT VTI:          0.246 m LVOT/AV VTI ratio: 0.74  AORTA Ao Root diam: 2.60 cm Ao Asc diam:  3.10 cm MITRAL VALVE                TRICUSPID VALVE MV Area (PHT): 3.39 cm     TR Peak grad:   20.8 mmHg MV Decel Time: 224 msec     TR Vmax:        228.00 cm/s MV E velocity: 76.00 cm/s MV A velocity: 103.00 cm/s  SHUNTS MV E/A ratio:  0.74         Systemic VTI:  0.25 m                             Systemic Diam: 1.80 cm                             Pulmonic VTI:  0.238 m Lavona Mound Tobb DO Electronically signed by Thomasene Ripple DO Signature Date/Time: 02/27/2023/4:49:43 PM    Final    CT CORONARY MORPH W/CTA COR W/SCORE W/CA W/CM &/OR WO/CM  Addendum Date: 02/27/2023   ADDENDUM REPORT: 02/27/2023 15:46 EXAM: OVER-READ INTERPRETATION  CT CHEST The following report is an over-read performed by radiologist Dr. Narda Rutherford of [facility] on 02/27/2023. This over-read does not include interpretation of cardiac or coronary anatomy or pathology. The coronary CTA interpretation by the cardiologist is attached. COMPARISON:  None. FINDINGS: Vascular: No aortic atherosclerosis. The included aorta is normal in caliber. Mediastinum/nodes: No adenopathy or mass. Unremarkable esophagus. Lungs: No focal airspace disease. Scattered calcified granuloma.  No pleural fluid. The included airways are patent. Upper abdomen: No acute findings. Tiny hypodensity in the left lobe of the liver is too small to characterize but likely cyst. Musculoskeletal: There are no acute or suspicious osseous abnormalities. Probable hemangioma within lower thoracic vertebra. IMPRESSION: Sequela prior granulomatous  disease with calcified nodules in both lungs. This is benign. No further follow-up imaging is recommended. Electronically Signed   By: Narda Rutherford M.D.   On: 02/27/2023 15:46   Result Date: 02/27/2023 CLINICAL DATA:  Chest pain EXAM: Cardiac/Coronary CTA TECHNIQUE: A non-contrast, gated CT scan was obtained with axial slices of 3 mm through the heart for calcium scoring. Calcium scoring was performed using the Agatston method. A 120 kV prospective, gated, contrast cardiac scan was obtained. Gantry rotation speed was 250 msecs and collimation was 0.6 mm. Two sublingual nitroglycerin tablets (0.8 mg) were given. The 3D data set was reconstructed in 5% intervals of the 35-75% of the R-R cycle. Diastolic phases were analyzed on a dedicated workstation using MPR, MIP, and VRT modes. The patient received 95 cc of contrast. FINDINGS: Image quality: Excellent. Noise artifact is: Limited. Coronary Arteries:  Normal coronary origin.  Right dominance. Left main: The left main is a large caliber vessel with a normal take off from the left coronary cusp that trifurcates into a LAD, LCX, and ramus intermedius. There is no plaque or stenosis. Left anterior descending artery: The ostial LAD contains minimal calcified plaque (<25%). The mid and distal segments are patent. The mid LAD contains a myocardial bridge (normal variant). The LAD gives off 2 patent diagonal branches. Ramus intermedius: Patent with no evidence of plaque or stenosis. Left circumflex artery: The LCX is non-dominant. The proximal LCX contains minimal mixed density plaque (<25%). The LCX gives off 2 patent obtuse  marginal branches. Right coronary artery: The RCA is dominant with normal take off from the right coronary cusp. There is minimal mixed density plaque (<25%). The RCA terminates as a PDA and right posterolateral branch without evidence of plaque or stenosis. Right Atrium: Right atrial size is within normal limits. Right Ventricle: The right ventricular cavity is within normal limits. Left Atrium: Left atrial size is normal in size with no left atrial appendage filling defect. Left Ventricle: The ventricular cavity size is within normal limits. Pulmonary arteries: Normal in size. Pulmonary veins: Normal pulmonary venous drainage. Pericardium: Normal thickness without significant effusion or calcium present. Cardiac valves: The aortic valve is trileaflet without significant calcification. The mitral valve is normal without significant calcification. Aorta: Normal caliber without significant disease. Extra-cardiac findings: See attached radiology report for non-cardiac structures. IMPRESSION: 1. Coronary calcium score of 181. This was 95th percentile for age-, sex, and race-matched controls. 2. Total plaque volume 169 mm3 which is 70th percentile for age- and sex-matched controls (calcified plaque 38 mm3; non-calcified plaque 131 mm3). TPV is severe. 3. Normal coronary origin with right dominance. 4. Minimal CAD (<25%) in the LAD/LCX/RCA. 5. Mid LAD myocardial bridge (normal variant). RECOMMENDATIONS: 1. Minimal non-obstructive CAD (0-24%). Consider non-atherosclerotic causes of chest pain. Consider preventive therapy and risk factor modification. Lennie Odor, MD Electronically Signed: By: Lennie Odor M.D. On: 02/27/2023 14:26   DG Chest 2 View  Result Date: 02/26/2023 CLINICAL DATA:  Chest pain. EXAM: CHEST - 2 VIEW COMPARISON:  Radiograph 07/05/2013 FINDINGS: The cardiomediastinal contours are normal. Chronic calcified granuloma, benign needing no further imaging follow-up. Pulmonary vasculature is normal.  No consolidation, pleural effusion, or pneumothorax. No acute osseous abnormalities are seen. IMPRESSION: No acute chest findings. Electronically Signed   By: Narda Rutherford M.D.   On: 02/26/2023 23:40    Microbiology: Results for orders placed or performed in visit on 02/20/22  Novel Coronavirus, NAA ([facility])     Status: None   Collection Time: 02/20/22  2:34 PM   Specimen: Nasopharyngeal(NP) swabs in vial transport medium  Result Value Ref Range Status   SARS-CoV-2, NAA Not Detected Not Detected Final    Comment: This nucleic acid amplification test was developed and its performance characteristics determined by [employer]. Nucleic acid amplification tests include RT-PCR and TMA. This test has not been FDA cleared or approved. This test has been authorized by FDA under an Emergency Use Authorization (EUA). This test is only authorized for the duration of time the declaration that circumstances exist justifying the authorization of the emergency use of in vitro diagnostic tests for detection of SARS-CoV-2 virus and/or diagnosis of COVID-19 infection under section 564(b)(1) of the Act, 21 U.S.C. 161WRU-0(A) (1), unless the authorization is terminated or revoked sooner. When diagnostic testing is negative, the possibility of a false negative result should be considered in the context of a patient's recent exposures and the presence of clinical signs and symptoms consistent with COVID-19. An individual without symptoms of COVID-19 and who is not shedding SARS-CoV-2 virus wo uld expect to have a negative (not detected) result in this assay.     Labs: CBC: Recent Labs  Lab 02/27/23 0016  WBC 10.3  HGB 13.8  HCT 40.6  MCV 90.0  PLT 343   Basic Metabolic Panel: Recent Labs  Lab 02/27/23 0016  NA 138  K 3.7  CL 101  CO2 28  GLUCOSE 101*  BUN 5*  CREATININE 0.74  CALCIUM 9.7   Liver Function Tests: Recent Labs  Lab 02/27/23 0016  AST 23  ALT 19  ALKPHOS  72  BILITOT 0.5  PROT 7.0  ALBUMIN 3.9   CBG: No results for input(s): "GLUCAP" in the last 168 hours.  Discharge time spent: less than 30 minutes.  Signed: Clydie Braun, MD [facility] 02/28/2023

## 2023-03-01 ENCOUNTER — Encounter: Payer: Self-pay | Admitting: Internal Medicine

## 2023-03-01 ENCOUNTER — Ambulatory Visit (INDEPENDENT_AMBULATORY_CARE_PROVIDER_SITE_OTHER): Payer: 59 | Admitting: Internal Medicine

## 2023-03-01 VITALS — BP 126/84 | HR 77 | Temp 97.9°F | Ht 62.0 in | Wt 180.0 lb

## 2023-03-01 DIAGNOSIS — Z6832 Body mass index (BMI) 32.0-32.9, adult: Secondary | ICD-10-CM | POA: Diagnosis not present

## 2023-03-01 DIAGNOSIS — K5730 Diverticulosis of large intestine without perforation or abscess without bleeding: Secondary | ICD-10-CM | POA: Diagnosis not present

## 2023-03-01 DIAGNOSIS — I071 Rheumatic tricuspid insufficiency: Secondary | ICD-10-CM | POA: Diagnosis not present

## 2023-03-01 DIAGNOSIS — I2584 Coronary atherosclerosis due to calcified coronary lesion: Secondary | ICD-10-CM | POA: Diagnosis not present

## 2023-03-01 DIAGNOSIS — I2510 Atherosclerotic heart disease of native coronary artery without angina pectoris: Secondary | ICD-10-CM

## 2023-03-01 DIAGNOSIS — R079 Chest pain, unspecified: Secondary | ICD-10-CM

## 2023-03-01 DIAGNOSIS — Z8249 Family history of ischemic heart disease and other diseases of the circulatory system: Secondary | ICD-10-CM | POA: Diagnosis not present

## 2023-03-01 DIAGNOSIS — E66811 Obesity, class 1: Secondary | ICD-10-CM | POA: Diagnosis not present

## 2023-03-01 DIAGNOSIS — E6609 Other obesity due to excess calories: Secondary | ICD-10-CM | POA: Diagnosis not present

## 2023-03-01 DIAGNOSIS — K9089 Other intestinal malabsorption: Secondary | ICD-10-CM | POA: Diagnosis not present

## 2023-03-01 DIAGNOSIS — I340 Nonrheumatic mitral (valve) insufficiency: Secondary | ICD-10-CM | POA: Diagnosis not present

## 2023-03-01 DIAGNOSIS — K52832 Lymphocytic colitis: Secondary | ICD-10-CM | POA: Diagnosis not present

## 2023-03-01 DIAGNOSIS — K219 Gastro-esophageal reflux disease without esophagitis: Secondary | ICD-10-CM | POA: Diagnosis not present

## 2023-03-01 MED ORDER — PANTOPRAZOLE SODIUM 40 MG PO TBEC: 40.0000 mg | DELAYED_RELEASE_TABLET | Freq: Every day | ORAL | 1 refills | Status: DC

## 2023-03-01 NOTE — Progress Notes (Signed)
I,Victoria T Deloria Lair, CMA,acting as a Neurosurgeon for Gwynneth Aliment, MD.,have documented all relevant documentation on the behalf of Gwynneth Aliment, MD,as directed by  Gwynneth Aliment, MD while in the presence of Gwynneth Aliment, MD.  Subjective:  Patient ID: Danielle Silva , female    DOB: [birth year] , 62 y.o.   MRN: [id number]  Chief Complaint  Patient presents with   ER F/U    HPI  Patient presents today for ER folow up. She presented to  [facility] ER  on 02/26/2023 for chest pain.  She states the pain started the morning of presentation.  It had been waxing and waning throughout the day. She initially thought her sx were due to reflux, but she had no relief with TUMS or PPI therapy.  There was associated nausea, dizziness, and lightheadedness.  She was treated with 324mg  ASA pta as well as 4mg  zofran and 1 SL nitroglycerin.  She had echo while in ER which was significant for:  Results:  1.Left ventricular ejection fraction, by estimation, is 60 to 65% . The left ventricle has normal function. The left ventricle has no regional wall motion abnormalities. Left ventricular diastolic parameters are consistent with Grade I diastolic dysfunction ( impaired relaxation) . 2. Right ventricular systolic function is normal. The right ventricular size is normal. 3. The mitral valve is myxomatous. Mild mitral valve regurgitation. No evidence of mitral stenosis. 4. Tricuspid valve regurgitation is mild to moderate. 5. The aortic valve is normal in structure. Aortic valve regurgitation is not visualized. No aortic stenosis is present. 6. The inferior vena cava is normal in size with greater than 50% respiratory variability, suggesting right atrial pressure of 3 mmHg.  She was prescribed atorvastatin 40mg  at discharge, she has yet to start the medication. She does have upcoming Cardiology appt scheduled as well.   Today she reports feeling okay. She admits not having an appetite.   Letter sent to gyn for pap  result.         Past Medical History:  Diagnosis Date   Anxiety    Complication of anesthesia    Depression    Diverticulitis    GERD (gastroesophageal reflux disease)    Hypertension    PONV (postoperative nausea and vomiting)      Family History  Problem Relation Age of Onset   Diabetes Mother    Hypertension Mother    Hyperlipidemia Mother    Heart disease Mother        CABG   Hypertension Father    Kidney disease Father        kidney transplant   Thyroid disease Father    Heart disease Father        A-fib   Arthritis Brother      Current Outpatient Medications:    buPROPion (WELLBUTRIN XL) 300 MG 24 hr tablet, TAKE 1 TABLET BY MOUTH EVERY DAY, Disp: 90 tablet, Rfl: 2   cetirizine (ZYRTEC) 5 MG tablet, Take 5 mg by mouth daily., Disp: , Rfl:    Cyanocobalamin (VITAMIN B-12 PO), Take 1 capsule by mouth daily., Disp: , Rfl:    MAGNESIUM PO, Take 1 capsule by mouth daily., Disp: , Rfl:    ondansetron (ZOFRAN-ODT) 4 MG disintegrating tablet, Take 1 tablet (4 mg total) by mouth every 8 (eight) hours as needed for nausea or vomiting., Disp: 20 tablet, Rfl: 0   pantoprazole (PROTONIX) 40 MG tablet, Take 1 tablet (40 mg total) by mouth daily., Disp:  90 tablet, Rfl: 1   Semaglutide-Weight Management (WEGOVY) 2.4 MG/0.75ML SOAJ, Inject 2.4 mg into the skin once a week., Disp: 3 mL, Rfl: 2   valsartan-hydrochlorothiazide (DIOVAN HCT) 80-12.5 MG tablet, Take 1 tablet by mouth daily., Disp: 90 tablet, Rfl: 3   VITAMIN D PO, Take 1 capsule by mouth daily., Disp: , Rfl:    atorvastatin (LIPITOR) 40 MG tablet, Take 1 tablet (40 mg total) by mouth daily. (Patient not taking: Reported on 03/01/2023), Disp: 30 tablet, Rfl: 11   Allergies  Allergen Reactions   Macrobid [Nitrofurantoin Monohydrate Macrocrystals] Hives   Nsaids Other (See Comments)    Avoids bc of colitis    Percocet [Oxycodone-Acetaminophen] Itching and Nausea And Vomiting    Can take with benadryl     Review of  Systems  Constitutional: Negative.   Respiratory: Negative.    Cardiovascular: Negative.   Gastrointestinal:  Positive for nausea.  Neurological: Negative.   Psychiatric/Behavioral: Negative.       Today's Vitals   03/01/23 1556 03/01/23 1641  BP: 124/88 126/84  Pulse: 77   Temp: 97.9 F (36.6 C)   SpO2: 98%   Weight: 180 lb (81.6 kg)   Height: 5\' 2"  (1.575 m)    Body mass index is 32.92 kg/m.  Wt Readings from Last 3 Encounters:  03/01/23 180 lb (81.6 kg)  02/26/23 178 lb (80.7 kg)  11/28/22 184 lb 3.2 oz (83.6 kg)    BP Readings from Last 3 Encounters:  03/01/23 126/84  02/27/23 103/74  11/28/22 110/70     Objective:  Physical Exam Vitals and nursing note reviewed.  Constitutional:      Appearance: Normal appearance. She is obese.  HENT:     Head: Normocephalic and atraumatic.  Eyes:     Extraocular Movements: Extraocular movements intact.  Cardiovascular:     Rate and Rhythm: Normal rate and regular rhythm.     Heart sounds: Normal heart sounds.  Pulmonary:     Effort: Pulmonary effort is normal.     Breath sounds: Normal breath sounds.  Musculoskeletal:     Cervical back: Normal range of motion.  Skin:    General: Skin is warm.  Neurological:     General: No focal deficit present.     Mental Status: She is alert.  Psychiatric:        Mood and Affect: Mood normal.        Behavior: Behavior normal.       Assessment And Plan:  Chest pain, unspecified type Assessment & Plan: [place] notes reviewed in detail, meds reconciled. Encouraged to keep upcoming Cardiology appointment. Advised to avoid [facility] which could worsen her symptoms due to decreased gastric motility. While she does have cardiac risk factors, there is likely a GI component. Will send rx pantoprazole to take daily.    Coronary artery calcification of native artery Assessment & Plan: Chronic, LDL goal is less than 70. Prescribed atorvastatin 40mg  daily by ER, encouraged to take meds as  prescribed.    Mild mitral regurgitation Assessment & Plan: Seen on Echo, results reviewed with patient during the visit.    Tricuspid valve insufficiency, unspecified etiology Assessment & Plan: Seen on Echo, results reviewed with patient.   Class 1 obesity due to excess calories with serious comorbidity and body mass index (BMI) of 32.0 to 32.9 in adult Assessment & Plan: She is encouraged to strive for BMI less than 30 to decrease cardiac risk. Advised to aim for at least 150  minutes of exercise per week.    Family history of heart disease  Other orders -     Pantoprazole Sodium; Take 1 tablet (40 mg total) by mouth daily.  Dispense: 90 tablet; Refill: 1  She is encouraged to strive for BMI less than 30 to decrease cardiac risk. Advised to aim for at least 150 minutes of exercise per week.    Return if symptoms worsen or fail to improve.  Patient was given opportunity to ask questions. Patient verbalized understanding of the plan and was able to repeat key elements of the plan. All questions were answered to their satisfaction.    I, Gwynneth Aliment, MD, have reviewed all documentation for this visit. The documentation on 03/01/23 for the exam, diagnosis, procedures, and orders are all accurate and complete.   IF YOU HAVE BEEN REFERRED TO A SPECIALIST, IT MAY TAKE 1-2 WEEKS TO SCHEDULE/PROCESS THE REFERRAL. IF YOU HAVE NOT HEARD FROM US/SPECIALIST IN TWO WEEKS, PLEASE GIVE [place] A CALL AT [phone].   THE PATIENT IS ENCOURAGED TO PRACTICE SOCIAL DISTANCING DUE TO THE COVID-19 PANDEMIC.

## 2023-03-05 ENCOUNTER — Encounter: Payer: Self-pay | Admitting: Internal Medicine

## 2023-03-11 DIAGNOSIS — I2510 Atherosclerotic heart disease of native coronary artery without angina pectoris: Secondary | ICD-10-CM | POA: Insufficient documentation

## 2023-03-11 DIAGNOSIS — I071 Rheumatic tricuspid insufficiency: Secondary | ICD-10-CM | POA: Insufficient documentation

## 2023-03-11 DIAGNOSIS — I340 Nonrheumatic mitral (valve) insufficiency: Secondary | ICD-10-CM | POA: Insufficient documentation

## 2023-03-11 NOTE — Assessment & Plan Note (Signed)
Seen on Echo, results reviewed with patient.

## 2023-03-11 NOTE — Assessment & Plan Note (Signed)
Seen on Echo, results reviewed with patient during the visit.

## 2023-03-11 NOTE — Assessment & Plan Note (Signed)
She is encouraged to strive for BMI less than 30 to decrease cardiac risk. Advised to aim for at least 150 minutes of exercise per week.

## 2023-03-11 NOTE — Assessment & Plan Note (Signed)
Chronic, LDL goal is less than 70. Prescribed atorvastatin 40mg  daily by ER, encouraged to take meds as prescribed.

## 2023-03-11 NOTE — Assessment & Plan Note (Addendum)
[place] notes reviewed in detail, meds reconciled. Encouraged to keep upcoming Cardiology appointment. Advised to avoid [facility] which could worsen her symptoms due to decreased gastric motility. While she does have cardiac risk factors, there is likely a GI component. Will send rx pantoprazole to take daily.

## 2023-03-12 ENCOUNTER — Encounter: Payer: Self-pay | Admitting: Nurse Practitioner

## 2023-03-12 ENCOUNTER — Ambulatory Visit: Payer: 59 | Attending: Nurse Practitioner | Admitting: Nurse Practitioner

## 2023-03-12 VITALS — BP 100/58 | HR 73 | Ht 62.0 in | Wt 183.0 lb

## 2023-03-12 DIAGNOSIS — R079 Chest pain, unspecified: Secondary | ICD-10-CM | POA: Diagnosis not present

## 2023-03-12 DIAGNOSIS — E669 Obesity, unspecified: Secondary | ICD-10-CM

## 2023-03-12 DIAGNOSIS — I10 Essential (primary) hypertension: Secondary | ICD-10-CM | POA: Diagnosis not present

## 2023-03-12 DIAGNOSIS — K219 Gastro-esophageal reflux disease without esophagitis: Secondary | ICD-10-CM

## 2023-03-12 DIAGNOSIS — E785 Hyperlipidemia, unspecified: Secondary | ICD-10-CM

## 2023-03-12 DIAGNOSIS — I2510 Atherosclerotic heart disease of native coronary artery without angina pectoris: Secondary | ICD-10-CM

## 2023-03-12 MED ORDER — ISOSORBIDE MONONITRATE ER 30 MG PO TB24: 15.0000 mg | ORAL_TABLET | Freq: Every day | ORAL | 3 refills | Status: DC

## 2023-03-12 MED ORDER — ROSUVASTATIN CALCIUM 10 MG PO TABS: 10.0000 mg | ORAL_TABLET | Freq: Every day | ORAL | 3 refills | Status: DC

## 2023-03-12 NOTE — Progress Notes (Unsigned)
Office Visit    Patient Name: Danielle Silva Date of Encounter: 03/12/2023  Primary Care Provider:  Dorothyann Peng, MD Primary Cardiologist:  Reatha Harps, MD  Chief Complaint    62 year old female with a history of nonobstructive CAD, hypertension, hyperlipidemia, GERD, obesity, and anxiety who presents for ED follow-up related to CAD/chest pain.  Past Medical History    Past Medical History:  Diagnosis Date   Anxiety    Complication of anesthesia    Depression    Diverticulitis    GERD (gastroesophageal reflux disease)    Hypertension    PONV (postoperative nausea and vomiting)    Past Surgical History:  Procedure Laterality Date   ANKLE FRACTURE SURGERY Left 2011   Left ankle   CESAREAN SECTION  1985   CHOLECYSTECTOMY  1985   SHOULDER ARTHROSCOPY WITH SUBACROMIAL DECOMPRESSION Right 02/28/2021   Procedure: SHOULDER ARTHROSCOPY ROTATOR CUFF DEBRIDEMENT, SUBACROMIAL DECOMPRESSION;  Surgeon: Jones Broom, MD;  Location: [facility];  Service: Orthopedics;  Laterality: Right;   TONSILECTOMY/ADENOIDECTOMY WITH MYRINGOTOMY Bilateral 1980   TUBAL LIGATION  1985    Allergies  Allergies  Allergen Reactions   Macrobid [Nitrofurantoin Monohydrate Macrocrystals] Hives   Nsaids Other (See Comments)    Avoids bc of colitis    Percocet [Oxycodone-Acetaminophen] Itching and Nausea And Vomiting    Can take with benadryl     Labs/Other Studies Reviewed    The following studies were reviewed today:  Cardiac Studies & Procedures       ECHOCARDIOGRAM  ECHOCARDIOGRAM COMPLETE 02/27/2023  Narrative ECHOCARDIOGRAM REPORT    Patient Name:   Danielle Silva Date of Exam: 02/27/2023 Medical Rec #:  [id number]      Height:       62.0 in Accession #:    [id number]     Weight:       178.0 lb Date of Birth:  1961-03-29     BSA:          1.819 m Patient Age:    61 years       BP:           127/82 mmHg Patient Gender: F              HR:           68  bpm. Exam Location:  Inpatient  Procedure: 2D Echo, Color Doppler and Cardiac Doppler  Indications:    Chest Pain  History:        Patient has no prior history of Echocardiogram examinations. Signs/Symptoms:Chest Pain; Risk Factors:Hypertension, Dyslipidemia and Obesity.  Sonographer:    Milbert Coulter Referring Phys: [id number] RONDELL A SMITH  IMPRESSIONS   1. Left ventricular ejection fraction, by estimation, is 60 to 65%. The left ventricle has normal function. The left ventricle has no regional wall motion abnormalities. Left ventricular diastolic parameters are consistent with Grade I diastolic dysfunction (impaired relaxation). 2. Right ventricular systolic function is normal. The right ventricular size is normal. 3. The mitral valve is myxomatous. Mild mitral valve regurgitation. No evidence of mitral stenosis. 4. Tricuspid valve regurgitation is mild to moderate. 5. The aortic valve is normal in structure. Aortic valve regurgitation is not visualized. No aortic stenosis is present. 6. The inferior vena cava is normal in size with greater than 50% respiratory variability, suggesting right atrial pressure of 3 mmHg.  FINDINGS Left Ventricle: Left ventricular ejection fraction, by estimation, is 60 to 65%. The left ventricle has normal function. The left ventricle  has no regional wall motion abnormalities. The left ventricular internal cavity size was normal in size. There is no left ventricular hypertrophy. Left ventricular diastolic parameters are consistent with Grade I diastolic dysfunction (impaired relaxation).  Right Ventricle: The right ventricular size is normal. No increase in right ventricular wall thickness. Right ventricular systolic function is normal.  Left Atrium: Left atrial size was normal in size.  Right Atrium: Right atrial size was normal in size.  Pericardium: There is no evidence of pericardial effusion.  Mitral Valve: The mitral valve is myxomatous. Mild  mitral valve regurgitation. No evidence of mitral valve stenosis.  Tricuspid Valve: The tricuspid valve is normal in structure. Tricuspid valve regurgitation is mild to moderate. No evidence of tricuspid stenosis.  Aortic Valve: The aortic valve is normal in structure. Aortic valve regurgitation is not visualized. No aortic stenosis is present. Aortic valve mean gradient measures 5.0 mmHg. Aortic valve peak gradient measures 8.9 mmHg. Aortic valve area, by VTI measures 1.89 cm.  Pulmonic Valve: The pulmonic valve was normal in structure. Pulmonic valve regurgitation is not visualized. No evidence of pulmonic stenosis.  Aorta: The aortic root is normal in size and structure.  Venous: The inferior vena cava is normal in size with greater than 50% respiratory variability, suggesting right atrial pressure of 3 mmHg.  IAS/Shunts: No atrial level shunt detected by color flow Doppler.   LEFT VENTRICLE PLAX 2D LVIDd:         4.50 cm   Diastology LVIDs:         2.90 cm   LV e' medial:    9.36 cm/s LV PW:         0.80 cm   LV E/e' medial:  8.1 LV IVS:        0.80 cm   LV e' lateral:   14.30 cm/s LVOT diam:     1.80 cm   LV E/e' lateral: 5.3 LV SV:         63 LV SV Index:   34 LVOT Area:     2.54 cm   RIGHT VENTRICLE RV Basal diam:  2.60 cm RV Mid diam:    2.20 cm RV S prime:     13.10 cm/s TAPSE (M-mode): 1.9 cm  LEFT ATRIUM             Index        RIGHT ATRIUM           Index LA diam:        3.80 cm 2.09 cm/m   RA Area:     16.20 cm LA Vol (A2C):   26.0 ml 14.29 ml/m  RA Volume:   41.60 ml  22.87 ml/m LA Vol (A4C):   36.4 ml 20.01 ml/m LA Biplane Vol: 31.1 ml 17.09 ml/m AORTIC VALVE                    PULMONIC VALVE AV Area (Vmax):    1.93 cm     PV Vmax:        1.34 m/s AV Area (Vmean):   1.95 cm     PV Vmean:       97.400 cm/s AV Area (VTI):     1.89 cm     PV VTI:         0.305 m AV Vmax:           149.00 cm/s  PV Peak grad:   7.2 mmHg AV Vmean:  99.700 cm/s   PV Mean grad:   4.0 mmHg AV VTI:            0.331 m      RVOT Peak grad: 4 mmHg AV Peak Grad:      8.9 mmHg AV Mean Grad:      5.0 mmHg LVOT Vmax:         113.00 cm/s LVOT Vmean:        76.500 cm/s LVOT VTI:          0.246 m LVOT/AV VTI ratio: 0.74  AORTA Ao Root diam: 2.60 cm Ao Asc diam:  3.10 cm  MITRAL VALVE                TRICUSPID VALVE MV Area (PHT): 3.39 cm     TR Peak grad:   20.8 mmHg MV Decel Time: 224 msec     TR Vmax:        228.00 cm/s MV E velocity: 76.00 cm/s MV A velocity: 103.00 cm/s  SHUNTS MV E/A ratio:  0.74         Systemic VTI:  0.25 m Systemic Diam: 1.80 cm Pulmonic VTI:  0.238 m  Lavona Mound Tobb DO Electronically signed by Thomasene Ripple DO Signature Date/Time: 02/27/2023/4:49:43 PM    Final     CT SCANS  CT CORONARY MORPH W/CTA COR W/SCORE 02/27/2023  Addendum 02/27/2023  3:49 PM ADDENDUM REPORT: 02/27/2023 15:46  EXAM: OVER-READ INTERPRETATION  CT CHEST  The following report is an over-read performed by radiologist Dr. Narda Rutherford of [facility] on 02/27/2023. This over-read does not include interpretation of cardiac or coronary anatomy or pathology. The coronary CTA interpretation by the cardiologist is attached.  COMPARISON:  None.  FINDINGS: Vascular: No aortic atherosclerosis. The included aorta is normal in caliber.  Mediastinum/nodes: No adenopathy or mass. Unremarkable esophagus.  Lungs: No focal airspace disease. Scattered calcified granuloma. No pleural fluid. The included airways are patent.  Upper abdomen: No acute findings. Tiny hypodensity in the left lobe of the liver is too small to characterize but likely cyst.  Musculoskeletal: There are no acute or suspicious osseous abnormalities. Probable hemangioma within lower thoracic vertebra.  IMPRESSION: Sequela prior granulomatous disease with calcified nodules in both lungs. This is benign. No further follow-up imaging is recommended.   Electronically  Signed By: Narda Rutherford M.D. On: 02/27/2023 15:46  Narrative CLINICAL DATA:  Chest pain  EXAM: Cardiac/Coronary CTA  TECHNIQUE: A non-contrast, gated CT scan was obtained with axial slices of 3 mm through the heart for calcium scoring. Calcium scoring was performed using the Agatston method. A 120 kV prospective, gated, contrast cardiac scan was obtained. Gantry rotation speed was 250 msecs and collimation was 0.6 mm. Two sublingual nitroglycerin tablets (0.8 mg) were given. The 3D data set was reconstructed in 5% intervals of the 35-75% of the R-R cycle. Diastolic phases were analyzed on a dedicated workstation using MPR, MIP, and VRT modes. The patient received 95 cc of contrast.  FINDINGS: Image quality: Excellent.  Noise artifact is: Limited.  Coronary Arteries:  Normal coronary origin.  Right dominance.  Left main: The left main is a large caliber vessel with a normal take off from the left coronary cusp that trifurcates into a LAD, LCX, and ramus intermedius. There is no plaque or stenosis.  Left anterior descending artery: The ostial LAD contains minimal calcified plaque (<25%). The mid and distal segments are patent. The mid LAD contains a myocardial bridge (normal variant).  The LAD gives off 2 patent diagonal branches.  Ramus intermedius: Patent with no evidence of plaque or stenosis.  Left circumflex artery: The LCX is non-dominant. The proximal LCX contains minimal mixed density plaque (<25%). The LCX gives off 2 patent obtuse marginal branches.  Right coronary artery: The RCA is dominant with normal take off from the right coronary cusp. There is minimal mixed density plaque (<25%). The RCA terminates as a PDA and right posterolateral branch without evidence of plaque or stenosis.  Right Atrium: Right atrial size is within normal limits.  Right Ventricle: The right ventricular cavity is within normal limits.  Left Atrium: Left atrial size is normal  in size with no left atrial appendage filling defect.  Left Ventricle: The ventricular cavity size is within normal limits.  Pulmonary arteries: Normal in size.  Pulmonary veins: Normal pulmonary venous drainage.  Pericardium: Normal thickness without significant effusion or calcium present.  Cardiac valves: The aortic valve is trileaflet without significant calcification. The mitral valve is normal without significant calcification.  Aorta: Normal caliber without significant disease.  Extra-cardiac findings: See attached radiology report for non-cardiac structures.  IMPRESSION: 1. Coronary calcium score of 181. This was 95th percentile for age-, sex, and race-matched controls.  2. Total plaque volume 169 mm3 which is 70th percentile for age- and sex-matched controls (calcified plaque 38 mm3; non-calcified plaque 131 mm3). TPV is severe.  3. Normal coronary origin with right dominance.  4. Minimal CAD (<25%) in the LAD/LCX/RCA.  5. Mid LAD myocardial bridge (normal variant).  RECOMMENDATIONS: 1. Minimal non-obstructive CAD (0-24%). Consider non-atherosclerotic causes of chest pain. Consider preventive therapy and risk factor modification.  Lennie Odor, MD  Electronically Signed: By: Lennie Odor M.D. On: 02/27/2023 14:26         Recent Labs: 11/28/2022: TSH 1.830 02/27/2023: ALT 19; BUN 5; Creatinine, Ser 0.74; Hemoglobin 13.8; Platelets 343; Potassium 3.7; Sodium 138  Recent Lipid Panel    Component Value Date/Time   CHOL 210 (H) 11/28/2022 1655   TRIG 183 (H) 11/28/2022 1655   HDL 61 11/28/2022 1655   CHOLHDL 3.4 11/28/2022 1655   LDLCALC 117 (H) 11/28/2022 1655    History of Present Illness    62 year old female with the above past medical history including nonobstructive CAD, hypertension, hyperlipidemia, GERD, obesity, and anxiety.  She has a family history of CAD.  Patient reports remote history of stress test though she notes the test was not  fully completed due to patient having an asthma attack in the middle of the test.  She presented to the emergency room on 02/26/2023 in the setting of atypical chest discomfort.  Troponin was negative x 2.  EKG was without acute ischemic changes, nonspecific T wave inversion in V3 and V4.  Cardiology was consulted.  Coronary CTA revealed coronary calcium score of 191 (95th percentile), minimal nonobstructive CAD.  Echocardiogram showed EF 60 to 65%, normal LV function, no RWMA, G1 DD, normal RV, myxomatous mitral valve with mild mitral valve regurgitation, mild to moderate tricuspid valve regurgitation.  Her symptoms were felt to be possibly GI in nature.  She was started on aspirin and statin.  She was discharged home in stable condition on 02/27/2023.  She presents today for follow-up.  Since her recent ED visit she has been stable from a cardiac standpoint though she continues to note intermittent chest pain, which she describes as a sharp pain underneath her left breast that radiates to her back, both at rest and with activity.  Her symptoms are most noticeable when lying in bed, though she also has had symptoms with activities such as shopping at the grocery store.  She notes generalized weakness, fatigue, weakness in her lower extremities since starting Lipitor.  She is concerned that her symptoms of chest pain are coming from her heart.    Home Medications    Current Outpatient Medications  Medication Sig Dispense Refill   atorvastatin (LIPITOR) 40 MG tablet Take 1 tablet (40 mg total) by mouth daily. 30 tablet 11   buPROPion (WELLBUTRIN XL) 300 MG 24 hr tablet TAKE 1 TABLET BY MOUTH EVERY DAY 90 tablet 2   cetirizine (ZYRTEC) 5 MG tablet Take 5 mg by mouth daily.     Cyanocobalamin (VITAMIN B-12 PO) Take 1 capsule by mouth daily.     MAGNESIUM PO Take 1 capsule by mouth daily.     ondansetron (ZOFRAN-ODT) 4 MG disintegrating tablet Take 1 tablet (4 mg total) by mouth every 8 (eight) hours as  needed for nausea or vomiting. 20 tablet 0   pantoprazole (PROTONIX) 40 MG tablet Take 1 tablet (40 mg total) by mouth daily. 90 tablet 1   Semaglutide-Weight Management (WEGOVY) 2.4 MG/0.75ML SOAJ Inject 2.4 mg into the skin once a week. 3 mL 2   valsartan-hydrochlorothiazide (DIOVAN HCT) 80-12.5 MG tablet Take 1 tablet by mouth daily. 90 tablet 3   VITAMIN D PO Take 1 capsule by mouth daily.     No current facility-administered medications for this visit.     Review of Systems    She denies palpitations, dyspnea, pnd, orthopnea, n, v, dizziness, syncope, edema, weight gain, or early satiety. All other systems reviewed and are otherwise negative except as noted above.   Physical Exam    VS:  BP (!) 100/58 (BP Location: Left Arm, Patient Position: Sitting, Cuff Size: Normal)   Pulse 73   Ht 5\' 2"  (1.575 m)   Wt 183 lb (83 kg)   LMP 01/05/2012   SpO2 98%   BMI 33.47 kg/m   GEN: Well nourished, well developed, in no acute distress. HEENT: normal. Neck: Supple, no JVD, carotid bruits, or masses. Cardiac: RRR, no murmurs, rubs, or gallops. No clubbing, cyanosis, edema.  Radials/DP/PT 2+ and equal bilaterally.  Respiratory:  Respirations regular and unlabored, clear to auscultation bilaterally. GI: Soft, nontender, nondistended, BS + x 4. MS: no deformity or atrophy. Skin: warm and dry, no rash. Neuro:  Strength and sensation are intact. Psych: Normal affect.  Accessory Clinical Findings    ECG personally reviewed by me today -    - no acute changes.   Lab Results  Component Value Date   WBC 10.3 02/27/2023   HGB 13.8 02/27/2023   HCT 40.6 02/27/2023   MCV 90.0 02/27/2023   PLT 343 02/27/2023   Lab Results  Component Value Date   CREATININE 0.74 02/27/2023   BUN 5 (L) 02/27/2023   NA 138 02/27/2023   K 3.7 02/27/2023   CL 101 02/27/2023   CO2 28 02/27/2023   Lab Results  Component Value Date   ALT 19 02/27/2023   AST 23 02/27/2023   ALKPHOS 72 02/27/2023    BILITOT 0.5 02/27/2023   Lab Results  Component Value Date   CHOL 210 (H) 11/28/2022   HDL 61 11/28/2022   LDLCALC 117 (H) 11/28/2022   TRIG 183 (H) 11/28/2022   CHOLHDL 3.4 11/28/2022    Lab Results  Component Value Date   HGBA1C 5.2 11/28/2022  Assessment & Plan   1. CAD: Recent ED visit in the setting chest pain. Troponin was negative x2. EKG showed nonspecific TWI. Coronary CTA revealed coronary calcium score of 191 (95th percentile), minimal nonobstructive CAD. Echocardiogram showed EF 60 to 65%, normal LV function, no RWMA, G1 DD, normal RV, myxomatous mitral valve with mild mitral valve regurgitation, mild to moderate tricuspid valve regurgitation. She continues to note intermittent chest pain, symptoms are overall atypical. She remains concerned that her symptoms are coming from her heart. Will trial Imdur 15 mg daily.  Work-up to date overall reassuring.  No indication for further testing at this time.  Continue to monitor symptoms. Continue valsartan -hydrochlorothiazide, Crestor as below.  2. Hypertension:  BP borderline low in office today, but has been stable at home.  Continue current antihypertensive regimen.  3. Hyperlipidemia: LDL was 117 in 11/2022. She notes generalized weakness, fatigue, weakness in her lower extremities since starting Lipitor.  Will transition from Lipitor to Crestor 10 mg daily to see if her symptoms improve.  Will check fasting lipids, LFTs in 6 to 8 weeks.   4. GERD: Previously noted that there was a possible GI component to her above described chest pain, however, she does not think that this is so.  She is taking Wegovy every other week now and has remained on Protonix without improvement in her symptoms. Following with GI.   5. Obesity: On Wegovy per PCP. Continue to encourage lifestyle modifications with diet and exercise.   6. Disposition: Follow-up next available with Dr. Flora Lipps.      Joylene Grapes, NP 03/12/2023, 8:58 AM

## 2023-03-12 NOTE — Patient Instructions (Signed)
Medication Instructions:  Stop Lipitor as directed Start Crestor 10 mg daily Start Imdur 15 mg daily  *If you need a refill on your cardiac medications before your next appointment, please call your pharmacy*   Lab Work: Fasting Lipid panel & LFTs in 6-8 weeks   Testing/Procedures: NONE ordered at this time of appointment     Follow-Up: At [facility], you and your health needs are our priority.  As part of our continuing mission to provide you with exceptional heart care, we have created designated Provider Care Teams.  These Care Teams include your primary Cardiologist (physician) and Advanced Practice Providers (APPs -  Physician Assistants and Nurse Practitioners) who all work together to provide you with the care you need, when you need it.  We recommend signing up for the patient portal called "MyChart".  Sign up information is provided on this After Visit Summary.  MyChart is used to connect with patients for Virtual Visits (Telemedicine).  Patients are able to view lab/test results, encounter notes, upcoming appointments, etc.  Non-urgent messages can be sent to your provider as well.   To learn more about what you can do with MyChart, go to [email or web address].    Your next appointment:    Next available   Provider:   Reatha Harps, MD

## 2023-03-13 ENCOUNTER — Encounter: Payer: Self-pay | Admitting: Nurse Practitioner

## 2023-04-08 DIAGNOSIS — R0981 Nasal congestion: Secondary | ICD-10-CM | POA: Diagnosis not present

## 2023-04-08 DIAGNOSIS — J019 Acute sinusitis, unspecified: Secondary | ICD-10-CM | POA: Diagnosis not present

## 2023-04-08 DIAGNOSIS — H6503 Acute serous otitis media, bilateral: Secondary | ICD-10-CM | POA: Diagnosis not present

## 2023-04-13 ENCOUNTER — Ambulatory Visit: Payer: 59 | Admitting: Cardiology

## 2023-04-22 DIAGNOSIS — R300 Dysuria: Secondary | ICD-10-CM | POA: Diagnosis not present

## 2023-04-22 DIAGNOSIS — N390 Urinary tract infection, site not specified: Secondary | ICD-10-CM | POA: Diagnosis not present

## 2023-04-24 NOTE — Progress Notes (Unsigned)
  Cardiology Office Note:  .   Date:  04/24/2023  ID:  Danielle Silva, DOB 1961-02-08, MRN [id number] PCP: Dorothyann Peng, MD  [facility] Providers Cardiologist:  Reatha Harps, MD { Click to update primary MD,subspecialty MD or APP then REFRESH:1}   History of Present Illness: .   Danielle Silva is a 62 y.o. female with history of CAD, HTN, HLD who presents for follow-up.      Problem List CAD -minimal CAD 02/27/2023 -CAC 181 (95th percentile) -TPV 169 (70th percentile) 2. HLD    ROS: All other ROS reviewed and negative. Pertinent positives noted in the HPI.     Studies Reviewed: Marland Kitchen        CCTA 02/27/2023 IMPRESSION: 1. Coronary calcium score of 181. This was 95th percentile for age-, sex, and race-matched controls.   2. Total plaque volume 169 mm3 which is 70th percentile for age- and sex-matched controls (calcified plaque 38 mm3; non-calcified plaque 131 mm3). TPV is severe.   3. Normal coronary origin with right dominance.   4. Minimal CAD (<25%) in the LAD/LCX/RCA.   5. Mid LAD myocardial bridge (normal variant).  TTE 02/27/2023  1. Left ventricular ejection fraction, by estimation, is 60 to 65%. The  left ventricle has normal function. The left ventricle has no regional  wall motion abnormalities. Left ventricular diastolic parameters are  consistent with Grade I diastolic  dysfunction (impaired relaxation).   2. Right ventricular systolic function is normal. The right ventricular  size is normal.   3. The mitral valve is myxomatous. Mild mitral valve regurgitation. No  evidence of mitral stenosis.   4. Tricuspid valve regurgitation is mild to moderate.   5. The aortic valve is normal in structure. Aortic valve regurgitation is  not visualized. No aortic stenosis is present.   6. The inferior vena cava is normal in size with greater than 50%  respiratory variability, suggesting right atrial pressure of 3 mmHg.  Physical Exam:   VS:  LMP 01/05/2012     Wt Readings from Last 3 Encounters:  03/12/23 183 lb (83 kg)  03/01/23 180 lb (81.6 kg)  02/26/23 178 lb (80.7 kg)    GEN: Well nourished, well developed in no acute distress NECK: No JVD; No carotid bruits CARDIAC: ***RRR, no murmurs, rubs, gallops RESPIRATORY:  Clear to auscultation without rales, wheezing or rhonchi  ABDOMEN: Soft, non-tender, non-distended EXTREMITIES:  No edema; No deformity  ASSESSMENT AND PLAN: .   ***    {Are you ordering a CV Procedure (e.g. stress test, cath, DCCV, TEE, etc)?   Press F2        :[id number]}   Follow-up: No follow-ups on file.  Time Spent with Patient: I have spent a total of *** minutes caring for this patient today face to face, ordering and reviewing labs/tests, reviewing prior records/medical history, examining the patient, establishing an assessment and plan, communicating results/findings to the patient/family, and documenting in the medical record.   Signed, Lenna Gilford. Flora Lipps, MD, [facility]  [facility]  [address] [place], [place] [address] [phone]  12:43 PM

## 2023-04-26 ENCOUNTER — Ambulatory Visit: Payer: 59 | Admitting: Cardiovascular Disease

## 2023-04-26 DIAGNOSIS — I2510 Atherosclerotic heart disease of native coronary artery without angina pectoris: Secondary | ICD-10-CM

## 2023-04-26 DIAGNOSIS — E785 Hyperlipidemia, unspecified: Secondary | ICD-10-CM

## 2023-05-26 ENCOUNTER — Other Ambulatory Visit: Payer: Self-pay | Admitting: Internal Medicine

## 2023-05-27 ENCOUNTER — Other Ambulatory Visit: Payer: Self-pay | Admitting: Internal Medicine

## 2023-05-31 ENCOUNTER — Encounter: Payer: Self-pay | Admitting: Internal Medicine

## 2023-05-31 ENCOUNTER — Ambulatory Visit (INDEPENDENT_AMBULATORY_CARE_PROVIDER_SITE_OTHER): Payer: 59 | Admitting: Internal Medicine

## 2023-05-31 VITALS — BP 126/82 | HR 58 | Temp 97.8°F | Ht 62.0 in | Wt 182.6 lb

## 2023-05-31 DIAGNOSIS — I119 Hypertensive heart disease without heart failure: Secondary | ICD-10-CM

## 2023-05-31 DIAGNOSIS — I2584 Coronary atherosclerosis due to calcified coronary lesion: Secondary | ICD-10-CM

## 2023-05-31 DIAGNOSIS — I2510 Atherosclerotic heart disease of native coronary artery without angina pectoris: Secondary | ICD-10-CM | POA: Diagnosis not present

## 2023-05-31 DIAGNOSIS — E66811 Obesity, class 1: Secondary | ICD-10-CM | POA: Diagnosis not present

## 2023-05-31 DIAGNOSIS — E041 Nontoxic single thyroid nodule: Secondary | ICD-10-CM

## 2023-05-31 DIAGNOSIS — E7800 Pure hypercholesterolemia, unspecified: Secondary | ICD-10-CM | POA: Diagnosis not present

## 2023-05-31 DIAGNOSIS — E6609 Other obesity due to excess calories: Secondary | ICD-10-CM | POA: Diagnosis not present

## 2023-05-31 DIAGNOSIS — E2839 Other primary ovarian failure: Secondary | ICD-10-CM

## 2023-05-31 DIAGNOSIS — I10 Essential (primary) hypertension: Secondary | ICD-10-CM | POA: Diagnosis not present

## 2023-05-31 DIAGNOSIS — Z6833 Body mass index (BMI) 33.0-33.9, adult: Secondary | ICD-10-CM | POA: Diagnosis not present

## 2023-05-31 NOTE — Progress Notes (Signed)
 I,Danielle Silva, CMA,acting as a neurosurgeon for Danielle LOISE Slocumb, MD.,have documented all relevant documentation on the behalf of Danielle LOISE Slocumb, MD,as directed by  Danielle LOISE Slocumb, MD while in the presence of Danielle LOISE Slocumb, MD.  Subjective:  Patient ID: Danielle Silva , female    DOB: Mar 26, 1961 , 63 y.o.   MRN: [id number]  Chief Complaint  Patient presents with   Hypertension   Hyperlipidemia    HPI  Patient presents today for bpc. She reports compliance with medication. Denies headache, chest pain & sob. She has no specific questions or concerns.      Hypertension This is a chronic problem. The current episode started more than 1 year ago. The problem has been gradually improving since onset. The problem is controlled. Pertinent negatives include no blurred vision, chest pain or shortness of breath. Risk factors for coronary artery disease include sedentary lifestyle and post-menopausal state. The current treatment provides moderate improvement. There is no history of kidney disease or CAD/MI.     Past Medical History:  Diagnosis Date   Anxiety    Complication of anesthesia    Depression    Diverticulitis    GERD (gastroesophageal reflux disease)    Hypertension    PONV (postoperative nausea and vomiting)      Family History  Problem Relation Age of Onset   Diabetes Mother    Hypertension Mother    Hyperlipidemia Mother    Heart disease Mother        CABG   Hypertension Father    Kidney disease Father        kidney transplant   Thyroid  disease Father    Heart disease Father        A-fib   Arthritis Brother      Current Outpatient Medications:    buPROPion  (WELLBUTRIN  XL) 300 MG 24 hr tablet, TAKE 1 TABLET BY MOUTH EVERY DAY, Disp: 90 tablet, Rfl: 2   cetirizine (ZYRTEC) 5 MG tablet, Take 5 mg by mouth daily., Disp: , Rfl:    Cyanocobalamin  (VITAMIN B-12 PO), Take 1 capsule by mouth daily., Disp: , Rfl:    isosorbide  mononitrate (IMDUR ) 30 MG 24 hr tablet,  Take 0.5 tablets (15 mg total) by mouth daily., Disp: 90 tablet, Rfl: 3   MAGNESIUM PO, Take 1 capsule by mouth daily., Disp: , Rfl:    ondansetron  (ZOFRAN -ODT) 4 MG disintegrating tablet, Take 1 tablet (4 mg total) by mouth every 8 (eight) hours as needed for nausea or vomiting., Disp: 20 tablet, Rfl: 0   pantoprazole  (PROTONIX ) 40 MG tablet, Take 1 tablet (40 mg total) by mouth daily., Disp: 90 tablet, Rfl: 1   rosuvastatin  (CRESTOR ) 10 MG tablet, Take 1 tablet (10 mg total) by mouth daily., Disp: 90 tablet, Rfl: 3   Semaglutide -Weight Management (WEGOVY ) 2.4 MG/0.75ML SOAJ, INJECT 2.4 MG INTO THE SKIN ONCE A WEEK., Disp: 3 mL, Rfl: 2   valsartan -hydrochlorothiazide  (DIOVAN  HCT) 80-12.5 MG tablet, Take 1 tablet by mouth daily., Disp: 90 tablet, Rfl: 3   VITAMIN D  PO, Take 1 capsule by mouth daily., Disp: , Rfl:    Allergies  Allergen Reactions   Macrobid [Nitrofurantoin Monohydrate Macrocrystals] Hives   Nsaids Other (See Comments)    Avoids bc of colitis    Percocet [Oxycodone -Acetaminophen ] Itching and Nausea And Vomiting    Can take with benadryl     Review of Systems  Constitutional: Negative.   Eyes:  Negative for blurred vision.  Respiratory: Negative.  Negative for shortness of breath.   Cardiovascular: Negative.  Negative for chest pain.  Gastrointestinal: Negative.   Neurological: Negative.   Psychiatric/Behavioral: Negative.       Today's Vitals   05/31/23 1536  BP: 126/82  Pulse: (!) 58  Temp: 97.8 F (36.6 C)  SpO2: 98%  Weight: 182 lb 9.6 oz (82.8 kg)  Height: 5' 2 (1.575 m)   Body mass index is 33.4 kg/m.  Wt Readings from Last 3 Encounters:  05/31/23 182 lb 9.6 oz (82.8 kg)  03/12/23 183 lb (83 kg)  03/01/23 180 lb (81.6 kg)    BP Readings from Last 3 Encounters:  05/31/23 126/82  03/12/23 (!) 100/58  03/01/23 126/84    Objective:  Physical Exam Vitals and nursing note reviewed.  Constitutional:      Appearance: Normal appearance.  HENT:      Head: Normocephalic and atraumatic.  Eyes:     Extraocular Movements: Extraocular movements intact.  Cardiovascular:     Rate and Rhythm: Normal rate and regular rhythm.     Heart sounds: Normal heart sounds.  Pulmonary:     Effort: Pulmonary effort is normal.     Breath sounds: Normal breath sounds.  Skin:    General: Skin is warm.  Neurological:     General: No focal deficit present.     Mental Status: She is alert.  Psychiatric:        Mood and Affect: Mood normal.        Behavior: Behavior normal.         Assessment And Plan:  Hypertensive heart disease without heart failure Assessment & Plan: Chronic, fair control. Goal BP<130/80. She will c/w valsartan /hct 80/12.5mg  daily.  She did not tolerate Imdur  as prescribed by Cardiology. She may benefit from low-dose amlodipine  since this is anti-anginal therapy.   I will check renal function today. She will follow up in six months for re-evaluation.   Orders: -     BMP8+eGFR  Coronary artery calcification of native artery Assessment & Plan: Chronic, now on statin therapy. LDL goal is less than 70.  She will continue with rosuvastatin  10mg  daily.  She is encouraged to follow a heart healthy lifestyle.    Pure hypercholesterolemia Assessment & Plan: Chronic, LDL goal is less than 70 due to underlying non-obstructive CAD.  She is encouraged to avoid fried foods, decrease intake of processed meats and live an active lifestyle.   Orders: -     Lipid panel -     ALT  Thyroid  nodule -     US  THYROID ; Future  Estrogen deficiency -     DG Bone Density; Future  Class 1 obesity due to excess calories with serious comorbidity and body mass index (BMI) of 33.0 to 33.9 in adult Assessment & Plan: She is encouraged to strive for BMI less than 30 to decrease cardiac risk. Advised to aim for at least 150 minutes of exercise per week.  She will continue with her remaining supply of Wegovy .        Return in 12 weeks (on 08/23/2023),  or weight check.  Patient was given opportunity to ask questions. Patient verbalized understanding of the plan and was able to repeat key elements of the plan. All questions were answered to their satisfaction.    I, Danielle LOISE Slocumb, MD, have reviewed all documentation for this visit. The documentation on 05/31/23 for the exam, diagnosis, procedures, and orders are all accurate and complete.   IF YOU  HAVE BEEN REFERRED TO A SPECIALIST, IT MAY TAKE 1-2 WEEKS TO SCHEDULE/PROCESS THE REFERRAL. IF YOU HAVE NOT HEARD FROM US /SPECIALIST IN TWO WEEKS, PLEASE GIVE US  A CALL AT [phone].   THE PATIENT IS ENCOURAGED TO PRACTICE SOCIAL DISTANCING DUE TO THE COVID-19 PANDEMIC.

## 2023-05-31 NOTE — Patient Instructions (Signed)
 Hypertension, Adult Hypertension is another name for high blood pressure. High blood pressure forces your heart to work harder to pump blood. This can cause problems over time. There are two numbers in a blood pressure reading. There is a top number (systolic) over a bottom number (diastolic). It is best to have a blood pressure that is below 120/80. What are the causes? The cause of this condition is not known. Some other conditions can lead to high blood pressure. What increases the risk? Some lifestyle factors can make you more likely to develop high blood pressure: Smoking. Not getting enough exercise or physical activity. Being overweight. Having too much fat, sugar, calories, or salt (sodium) in your diet. Drinking too much alcohol. Other risk factors include: Having any of these conditions: Heart disease. Diabetes. High cholesterol. Kidney disease. Obstructive sleep apnea. Having a family history of high blood pressure and high cholesterol. Age. The risk increases with age. Stress. What are the signs or symptoms? High blood pressure may not cause symptoms. Very high blood pressure (hypertensive crisis) may cause: Headache. Fast or uneven heartbeats (palpitations). Shortness of breath. Nosebleed. Vomiting or feeling like you may vomit (nauseous). Changes in how you see. Very bad chest pain. Feeling dizzy. Seizures. How is this treated? This condition is treated by making healthy lifestyle changes, such as: Eating healthy foods. Exercising more. Drinking less alcohol. Your doctor may prescribe medicine if lifestyle changes do not help enough and if: Your top number is above 130. Your bottom number is above 80. Your personal target blood pressure may vary. Follow these instructions at home: Eating and drinking  If told, follow the DASH eating plan. To follow this plan: Fill one half of your plate at each meal with fruits and vegetables. Fill one fourth of your plate  at each meal with whole grains. Whole grains include whole-wheat pasta, brown rice, and whole-grain bread. Eat or drink low-fat dairy products, such as skim milk or low-fat yogurt. Fill one fourth of your plate at each meal with low-fat (lean) proteins. Low-fat proteins include fish, chicken without skin, eggs, beans, and tofu. Avoid fatty meat, cured and processed meat, or chicken with skin. Avoid pre-made or processed food. Limit the amount of salt in your diet to less than 1,500 mg each day. Do not drink alcohol if: Your doctor tells you not to drink. You are pregnant, may be pregnant, or are planning to become pregnant. If you drink alcohol: Limit how much you have to: 0-1 drink a day for women. 0-2 drinks a day for men. Know how much alcohol is in your drink. In the [place] one drink equals one 12 oz bottle of beer (355 mL), one 5 oz glass of wine (148 mL), or one 1 oz glass of hard liquor (44 mL). Lifestyle  Work with your doctor to stay at a healthy weight or to lose weight. Ask your doctor what the best weight is for you. Get at least 30 minutes of exercise that causes your heart to beat faster (aerobic exercise) most days of the week. This may include walking, swimming, or biking. Get at least 30 minutes of exercise that strengthens your muscles (resistance exercise) at least 3 days a week. This may include lifting weights or doing Pilates. Do not smoke or use any products that contain nicotine or tobacco. If you need help quitting, ask your doctor. Check your blood pressure at home as told by your doctor. Keep all follow-up visits. Medicines Take over-the-counter and prescription medicines  only as told by your doctor. Follow directions carefully. Do not skip doses of blood pressure medicine. The medicine does not work as well if you skip doses. Skipping doses also puts you at risk for problems. Ask your doctor about side effects or reactions to medicines that you should watch  for. Contact a doctor if: You think you are having a reaction to the medicine you are taking. You have headaches that keep coming back. You feel dizzy. You have swelling in your ankles. You have trouble with your vision. Get help right away if: You get a very bad headache. You start to feel mixed up (confused). You feel weak or numb. You feel faint. You have very bad pain in your: Chest. Belly (abdomen). You vomit more than once. You have trouble breathing. These symptoms may be an emergency. Get help right away. Call 911. Do not wait to see if the symptoms will go away. Do not drive yourself to the hospital. Summary Hypertension is another name for high blood pressure. High blood pressure forces your heart to work harder to pump blood. For most people, a normal blood pressure is less than 120/80. Making healthy choices can help lower blood pressure. If your blood pressure does not get lower with healthy choices, you may need to take medicine. This information is not intended to replace advice given to you by your health care provider. Make sure you discuss any questions you have with your health care provider. Document Revised: 02/24/2021 Document Reviewed: 02/24/2021 Elsevier Patient Education  2024 [employer].

## 2023-06-01 LAB — BMP8+EGFR
BUN/Creatinine Ratio: 15 (ref 12–28)
BUN: 12 mg/dL (ref 8–27)
CO2: 25 mmol/L (ref 20–29)
Calcium: 10.1 mg/dL (ref 8.7–10.3)
Chloride: 99 mmol/L (ref 96–106)
Creatinine, Ser: 0.81 mg/dL (ref 0.57–1.00)
Glucose: 93 mg/dL (ref 70–99)
Potassium: 4.3 mmol/L (ref 3.5–5.2)
Sodium: 140 mmol/L (ref 134–144)
eGFR: 83 mL/min/{1.73_m2} (ref >59)

## 2023-06-01 LAB — LIPID PANEL
Chol/HDL Ratio: 2.3 {ratio} (ref 0.0–4.4)
Cholesterol, Total: 158 mg/dL (ref 100–199)
HDL: 68 mg/dL (ref >39)
LDL Chol Calc (NIH): 74 mg/dL (ref 0–99)
Triglycerides: 84 mg/dL (ref 0–149)
VLDL Cholesterol Cal: 16 mg/dL (ref 5–40)

## 2023-06-01 LAB — ALT: ALT: 21 [IU]/L (ref 0–32)

## 2023-06-02 DIAGNOSIS — E2839 Other primary ovarian failure: Secondary | ICD-10-CM | POA: Insufficient documentation

## 2023-06-02 DIAGNOSIS — E041 Nontoxic single thyroid nodule: Secondary | ICD-10-CM | POA: Insufficient documentation

## 2023-06-02 NOTE — Assessment & Plan Note (Signed)
 Chronic, now on statin therapy. LDL goal is less than 70.  She will continue with rosuvastatin 10mg  daily.  She is encouraged to follow a heart healthy lifestyle.

## 2023-06-02 NOTE — Assessment & Plan Note (Signed)
 She is encouraged to strive for BMI less than 30 to decrease cardiac risk. Advised to aim for at least 150 minutes of exercise per week.  She will continue with her remaining supply of Wegovy.

## 2023-06-02 NOTE — Assessment & Plan Note (Addendum)
 Chronic, fair control. Goal BP<130/80. She will c/w valsartan /hct 80/12.5mg  daily.  She did not tolerate Imdur  as prescribed by Cardiology. She may benefit from low-dose amlodipine  since this is anti-anginal therapy.   I will check renal function today. She will follow up in six months for re-evaluation.

## 2023-06-02 NOTE — Assessment & Plan Note (Signed)
 Chronic, LDL goal is less than 70 due to underlying non-obstructive CAD.  She is encouraged to avoid fried foods, decrease intake of processed meats and live an active lifestyle.

## 2023-06-06 ENCOUNTER — Encounter: Payer: Self-pay | Admitting: Internal Medicine

## 2023-06-12 ENCOUNTER — Encounter: Payer: Self-pay | Admitting: Internal Medicine

## 2023-06-13 ENCOUNTER — Ambulatory Visit
Admission: RE | Admit: 2023-06-13 | Discharge: 2023-06-13 | Disposition: A | Discharge status: Home / Self Care | Payer: 59 | Source: Ambulatory Visit | Attending: Internal Medicine | Admitting: Internal Medicine

## 2023-06-13 ENCOUNTER — Other Ambulatory Visit: Payer: Self-pay | Admitting: Internal Medicine

## 2023-06-13 DIAGNOSIS — E042 Nontoxic multinodular goiter: Secondary | ICD-10-CM | POA: Diagnosis not present

## 2023-06-13 DIAGNOSIS — E041 Nontoxic single thyroid nodule: Secondary | ICD-10-CM

## 2023-06-13 MED ORDER — AMLODIPINE BESYLATE 2.5 MG PO TABS: 2.5000 mg | ORAL_TABLET | Freq: Every day | ORAL | 11 refills | Status: DC

## 2023-06-17 ENCOUNTER — Other Ambulatory Visit: Payer: Self-pay | Admitting: Internal Medicine

## 2023-06-17 DIAGNOSIS — E041 Nontoxic single thyroid nodule: Secondary | ICD-10-CM

## 2023-06-24 NOTE — Progress Notes (Signed)
 Cardiology Office Note:  .   Date:  06/27/2023  ID:  Danielle Silva, DOB 03-20-1961, MRN [id number] PCP: Jarold Medici, MD  [facility] Providers Cardiologist:  Darryle ONEIDA Decent, MD { History of Present Illness: .   Danielle Silva is a 63 y.o. female with history of CAD who presents for follow-up.    History of Present Illness   Danielle Silva is a 63 year old female with non-obstructive coronary artery disease who presents for follow-up.  She has a history of difficulty tolerating higher doses of statins. Currently, she is on Crestor  10 mg daily, and her most recent LDL was 74, which is close to her goal. No current chest pain. Her primary care doctor had tried her on a low dose of amlodipine , which she takes at night, instead of Diovan . She has not been taking Diovan  recently. She experiences chest pain at night when lying down, which was thought to be related to vasospasms. She also has a history of acid reflux and is on Protonix , which has helped her symptoms significantly. No current chest discomfort.  Her blood pressure is well controlled on amlodipine  2.5 mg daily. She was previously on Diovan , which has been discontinued. Amlodipine  was added to address potential vasospastic angina, but symptoms have improved.  She has had issues with Lipitor in the past, experiencing leg aches. She is doing better on Crestor , although she reports general soreness and stiffness in the mornings, which she attributes to aging and possibly arthritis. She has not tried 20 mg of Crestor  yet.  She describes herself as 'pretty active,' engaging in walking and caring for her dogs and grandchildren. She follows a low-carb diet and is working on weight loss. She has a family history of heart disease, with both her parents having had heart troubles. She quit smoking approximately 25 years ago.          Problem List CAD -CAC 181 (95th percentile) -TPV 169 (70th percentile) -minimal stenosis  (<25%) 2. HLD -T chol 158, HDL 68, LDL 74, TG 84 3. HTN    ROS: All other ROS reviewed and negative. Pertinent positives noted in the HPI.     Studies Reviewed: SABRA       CCTA 02/27/2023 IMPRESSION: 1. Coronary calcium  score of 181. This was 95th percentile for age-, sex, and race-matched controls.   2. Total plaque volume 169 mm3 which is 70th percentile for age- and sex-matched controls (calcified plaque 38 mm3; non-calcified plaque 131 mm3). TPV is severe.   3. Normal coronary origin with right dominance.   4. Minimal CAD (<25%) in the LAD/LCX/RCA.   5. Mid LAD myocardial bridge (normal variant).  TTE 02/27/2023  1. Left ventricular ejection fraction, by estimation, is 60 to 65%. The  left ventricle has normal function. The left ventricle has no regional  wall motion abnormalities. Left ventricular diastolic parameters are  consistent with Grade I diastolic  dysfunction (impaired relaxation).   2. Right ventricular systolic function is normal. The right ventricular  size is normal.   3. The mitral valve is myxomatous. Mild mitral valve regurgitation. No  evidence of mitral stenosis.   4. Tricuspid valve regurgitation is mild to moderate.   5. The aortic valve is normal in structure. Aortic valve regurgitation is  not visualized. No aortic stenosis is present.   6. The inferior vena cava is normal in size with greater than 50%  respiratory variability, suggesting right atrial pressure of 3 mmHg.  Physical  Exam:   VS:  BP 116/88 (BP Location: Left Arm, Patient Position: Sitting, Cuff Size: Normal)   Pulse 72   Ht 5' 2 (1.575 m)   Wt 185 lb (83.9 kg)   LMP 01/05/2012   BMI 33.84 kg/m    Wt Readings from Last 3 Encounters:  06/27/23 185 lb (83.9 kg)  05/31/23 182 lb 9.6 oz (82.8 kg)  03/12/23 183 lb (83 kg)    GEN: Well nourished, well developed in no acute distress NECK: No JVD; No carotid bruits CARDIAC: RRR, no murmurs, rubs, gallops RESPIRATORY:  Clear to  auscultation without rales, wheezing or rhonchi  ABDOMEN: Soft, non-tender, non-distended EXTREMITIES:  No edema; No deformity  ASSESSMENT AND PLAN: .   Assessment and Plan    Hyperlipidemia LDL 74, close to goal. History of intolerance to higher doses of statins. Currently on Crestor  10mg  daily. Discussed options to further lower LDL including increasing Crestor  to 20mg  daily or adding Zetia. -Increase Crestor  to 20mg  daily. -Reassess in 6 months.  Hypertension Well controlled on amlodipine  2.5mg  daily. Diovan  to be discontinued. -Discontinue Diovan . -Continue amlodipine  2.5mg  daily.  Non-obstructive CAD No current symptoms of angina. Elevated coronary calcium  score at 181 (95th percentile). On aspirin  81mg  daily. -Continue aspirin  81mg  daily.  Obesity BMI 33. Patient is working on weight loss. -Encourage continued efforts for weight loss.  Follow-up in 6 months to reassess cholesterol.              Follow-up: Return in about 6 months (around 12/25/2023).  Signed, Darryle DASEN. Barbaraann, MD, [facility]  [facility]  [address], [address] [place], [place] [address] [phone]  10:04 AM

## 2023-06-27 ENCOUNTER — Encounter: Payer: Self-pay | Admitting: Cardiovascular Disease

## 2023-06-27 ENCOUNTER — Ambulatory Visit: Payer: 59 | Attending: Cardiovascular Disease | Admitting: Cardiovascular Disease

## 2023-06-27 VITALS — BP 116/88 | HR 72 | Ht 62.0 in | Wt 185.0 lb

## 2023-06-27 DIAGNOSIS — I10 Essential (primary) hypertension: Secondary | ICD-10-CM

## 2023-06-27 DIAGNOSIS — I2510 Atherosclerotic heart disease of native coronary artery without angina pectoris: Secondary | ICD-10-CM | POA: Diagnosis not present

## 2023-06-27 DIAGNOSIS — E785 Hyperlipidemia, unspecified: Secondary | ICD-10-CM

## 2023-06-27 DIAGNOSIS — E669 Obesity, unspecified: Secondary | ICD-10-CM

## 2023-06-27 MED ORDER — ROSUVASTATIN CALCIUM 20 MG PO TABS: 20.0000 mg | ORAL_TABLET | Freq: Every day | ORAL | 3 refills | Status: DC

## 2023-06-27 NOTE — Patient Instructions (Addendum)
 Medication Instructions:    Stop  taking Diovan - HCtZ  Continue  Amlodipine   2.5  mg daily    Increase to Rosuvastatin  20 mg daily - let Dr Burton know if you are able to tolerate the increase dose  *If you need a refill on your cardiac medications before your next appointment, please call your pharmacy*   Lab Work: Not  needed    Testing/Procedures:  Not needed  Follow-Up: At [facility], you and your health needs are our priority.  As part of our continuing mission to provide you with exceptional heart care, we have created designated Provider Care Teams.  These Care Teams include your primary Cardiologist (physician) and Advanced Practice Providers (APPs -  Physician Assistants and Nurse Practitioners) who all work together to provide you with the care you need, when you need it.     Your next appointment:   6 month(s)  The format for your next appointment:   In Person  Provider:   Damien Braver, NP

## 2023-07-25 ENCOUNTER — Inpatient Hospital Stay: Admission: RE | Admit: 2023-07-25 | Payer: 59 | Source: Ambulatory Visit

## 2023-07-26 DIAGNOSIS — R051 Acute cough: Secondary | ICD-10-CM | POA: Diagnosis not present

## 2023-07-26 DIAGNOSIS — R509 Fever, unspecified: Secondary | ICD-10-CM | POA: Diagnosis not present

## 2023-07-26 DIAGNOSIS — R070 Pain in throat: Secondary | ICD-10-CM | POA: Diagnosis not present

## 2023-07-26 DIAGNOSIS — R0981 Nasal congestion: Secondary | ICD-10-CM | POA: Diagnosis not present

## 2023-07-26 DIAGNOSIS — J101 Influenza due to other identified influenza virus with other respiratory manifestations: Secondary | ICD-10-CM | POA: Diagnosis not present

## 2023-08-27 ENCOUNTER — Encounter: Payer: Self-pay | Admitting: Internal Medicine

## 2023-08-27 ENCOUNTER — Ambulatory Visit (INDEPENDENT_AMBULATORY_CARE_PROVIDER_SITE_OTHER): Payer: 59 | Admitting: Internal Medicine

## 2023-08-27 VITALS — BP 110/70 | HR 98 | Temp 98.7°F | Ht 62.0 in | Wt 184.0 lb

## 2023-08-27 DIAGNOSIS — Z2821 Immunization not carried out because of patient refusal: Secondary | ICD-10-CM | POA: Diagnosis not present

## 2023-08-27 DIAGNOSIS — K219 Gastro-esophageal reflux disease without esophagitis: Secondary | ICD-10-CM

## 2023-08-27 DIAGNOSIS — E7800 Pure hypercholesterolemia, unspecified: Secondary | ICD-10-CM

## 2023-08-27 DIAGNOSIS — M545 Low back pain, unspecified: Secondary | ICD-10-CM | POA: Diagnosis not present

## 2023-08-27 DIAGNOSIS — I119 Hypertensive heart disease without heart failure: Secondary | ICD-10-CM | POA: Diagnosis not present

## 2023-08-27 DIAGNOSIS — I2510 Atherosclerotic heart disease of native coronary artery without angina pectoris: Secondary | ICD-10-CM

## 2023-08-27 DIAGNOSIS — E6609 Other obesity due to excess calories: Secondary | ICD-10-CM

## 2023-08-27 DIAGNOSIS — E66811 Obesity, class 1: Secondary | ICD-10-CM | POA: Diagnosis not present

## 2023-08-27 DIAGNOSIS — Z79899 Other long term (current) drug therapy: Secondary | ICD-10-CM | POA: Diagnosis not present

## 2023-08-27 DIAGNOSIS — I2584 Coronary atherosclerosis due to calcified coronary lesion: Secondary | ICD-10-CM

## 2023-08-27 DIAGNOSIS — Z6833 Body mass index (BMI) 33.0-33.9, adult: Secondary | ICD-10-CM

## 2023-08-27 DIAGNOSIS — G8929 Other chronic pain: Secondary | ICD-10-CM | POA: Diagnosis not present

## 2023-08-27 DIAGNOSIS — G9331 Postviral fatigue syndrome: Secondary | ICD-10-CM

## 2023-08-27 MED ORDER — PANTOPRAZOLE SODIUM 40 MG PO TBEC: 40.0000 mg | DELAYED_RELEASE_TABLET | Freq: Every day | ORAL | 1 refills | Status: DC

## 2023-08-27 MED ORDER — BUPROPION HCL ER (XL) 150 MG PO TB24: 150.0000 mg | ORAL_TABLET | ORAL | 2 refills | Status: DC

## 2023-08-27 MED ORDER — AMLODIPINE BESYLATE 2.5 MG PO TABS: 2.5000 mg | ORAL_TABLET | Freq: Every day | ORAL | 2 refills | Status: DC

## 2023-08-27 MED ORDER — WEGOVY 2.4 MG/0.75ML ~~LOC~~ SOAJ: 2.4000 mg | SUBCUTANEOUS | 2 refills | Status: DC

## 2023-08-27 NOTE — Assessment & Plan Note (Addendum)
 Chronic, her goal weight is 160lbs.  She will continue with [facility] 2.4mg  weekly. She is encouraged to incorporate more strength training into her weekly workout routine.  - Incorporate strength training twice a week. - Ensure protein intake of at least 100 grams daily.

## 2023-08-27 NOTE — Progress Notes (Signed)
 I,Danielle Silva, CMA,acting as a Neurosurgeon for Danielle Dung, MD.,have documented all relevant documentation on the behalf of Danielle Dung, MD,as directed by  Danielle Dung, MD while in the presence of Danielle Dung, MD.  Subjective:  Patient ID: Danielle Silva , female    DOB: Feb 14, 1961 , 63 y.o.   MRN: [id number]  Chief Complaint  Patient presents with   Weight Check    Patient presents today for a weight check.    Hypertension     Discussed the use of AI scribe software for clinical note transcription with the patient, who gave verbal consent to proceed.  History of Present Illness Danielle Silva is a 63 year old female who presents for weight check, currently taking Wegovy.  .  Today, she is also concerned about LBP.  She experiences significant back pain, described as aching and radiating from her hips, particularly when it rains. The pain is severe enough to disrupt her sleep, as it did at 1:45 AM recently. She manages the pain with walking, stretching, and taking Tylenol. She has not seen an orthopedic specialist recently but had an X-ray a couple of years ago that showed some 'slippage.'  She is currently taking several medications: amlodipine 2.5 mg, Wellbutrin 300 mg once daily, Zyrtec, pantoprazole, rosuvastatin (recently increased to 20 mg), and Wegovy. She has stopped taking valsartan as per previous instructions. She also takes vitamin D and calcium supplements. She has a history of taking tramadol and Flexeril for pain management and is considering using cyclobenzaprine (Flexeril) again to help with her symptoms.  She has started exercising more, including walking and riding her bike, and is trying to incorporate stretching into her routine. She is working on her fitness goals, aiming for 15,000 steps a day and incorporating strength training. She is also focused on meeting her protein intake goals, using protein powders and consulting with a nutritionist monthly as  part of her [facility] treatment plan.  She mentions feeling tired, which she attributes to recovering from the flu and her busy lifestyle.   Patient presents today for bpc. She reports compliance with medication. Denies headache, chest pain & sob. She has no specific questions or concerns.      Hypertension This is a chronic problem. The current episode started more than 1 year ago. The problem has been gradually improving since onset. The problem is controlled. Pertinent negatives include no blurred vision, chest pain or shortness of breath. Risk factors for coronary artery disease include sedentary lifestyle and post-menopausal state. The current treatment provides moderate improvement. There is no history of kidney disease or CAD/MI.     Past Medical History:  Diagnosis Date   Anxiety    Complication of anesthesia    Coronary artery disease    Depression    Diverticulitis    GERD (gastroesophageal reflux disease)    Hyperlipidemia    Hypertension    PONV (postoperative nausea and vomiting)      Family History  Problem Relation Age of Onset   Diabetes Mother    Hypertension Mother    Hyperlipidemia Mother    Heart disease Mother        CABG   Hypertension Father    Kidney disease Father        kidney transplant   Thyroid disease Father    Heart disease Father        A-fib   Arthritis Brother      Current Outpatient Medications:  buPROPion (WELLBUTRIN XL) 150 MG 24 hr tablet, Take 1 tablet (150 mg total) by mouth every morning., Disp: 90 tablet, Rfl: 2   cetirizine (ZYRTEC) 5 MG tablet, Take 5 mg by mouth daily., Disp: , Rfl:    Cyanocobalamin (VITAMIN B-12 PO), Take 1 capsule by mouth daily., Disp: , Rfl:    MAGNESIUM PO, Take 1 capsule by mouth daily., Disp: , Rfl:    ondansetron (ZOFRAN-ODT) 4 MG disintegrating tablet, Take 1 tablet (4 mg total) by mouth every 8 (eight) hours as needed for nausea or vomiting., Disp: 20 tablet, Rfl: 0   rosuvastatin (CRESTOR) 20 MG  tablet, Take 1 tablet (20 mg total) by mouth daily., Disp: 90 tablet, Rfl: 3   VITAMIN D PO, Take 1 capsule by mouth daily., Disp: , Rfl:    amLODipine (NORVASC) 2.5 MG tablet, Take 1 tablet (2.5 mg total) by mouth daily., Disp: 90 tablet, Rfl: 2   pantoprazole (PROTONIX) 40 MG tablet, Take 1 tablet (40 mg total) by mouth daily., Disp: 90 tablet, Rfl: 1   Semaglutide-Weight Management (WEGOVY) 2.4 MG/0.75ML SOAJ, Inject 2.4 mg into the skin once a week., Disp: 3 mL, Rfl: 2   Allergies  Allergen Reactions   Macrobid [Nitrofurantoin Monohydrate Macrocrystals] Hives   Nsaids Other (See Comments)    Avoids bc of colitis    Percocet [Oxycodone-Acetaminophen] Itching and Nausea And Vomiting    Can take with benadryl     Review of Systems  Constitutional: Negative.   Eyes:  Negative for blurred vision.  Respiratory: Negative.  Negative for shortness of breath.   Cardiovascular: Negative.  Negative for chest pain.  Gastrointestinal: Negative.   Musculoskeletal:  Positive for back pain.  Neurological: Negative.   Psychiatric/Behavioral: Negative.       Today's Vitals   08/27/23 1418  BP: 110/70  Pulse: 98  Temp: 98.7 F (37.1 C)  TempSrc: Oral  Weight: 184 lb (83.5 kg)  Height: 5\' 2"  (1.575 m)  PainSc: 6   PainLoc: Back   Body mass index is 33.65 kg/m.  Wt Readings from Last 3 Encounters:  08/27/23 184 lb (83.5 kg)  06/27/23 185 lb (83.9 kg)  05/31/23 182 lb 9.6 oz (82.8 kg)    The 10-year ASCVD risk score (Arnett DK, et al., 2019) is: 2.4%   Values used to calculate the score:     Age: 28 years     Sex: Female     Is Non-Hispanic African American: No     Diabetic: No     Tobacco smoker: No     Systolic Blood Pressure: 110 mmHg     Is BP treated: Yes     HDL Cholesterol: 68 mg/dL     Total Cholesterol: 158 mg/dL  Objective:  Physical Exam Vitals and nursing note reviewed.  Constitutional:      Appearance: Normal appearance.  HENT:     Head: Normocephalic and  atraumatic.  Eyes:     Extraocular Movements: Extraocular movements intact.  Cardiovascular:     Rate and Rhythm: Normal rate and regular rhythm.     Heart sounds: Normal heart sounds.  Pulmonary:     Effort: Pulmonary effort is normal.     Breath sounds: Normal breath sounds.  Musculoskeletal:        General: Tenderness present.     Cervical back: Normal range of motion.  Skin:    General: Skin is warm.  Neurological:     General: No focal deficit present.  Mental Status: She is alert.  Psychiatric:        Mood and Affect: Mood normal.        Behavior: Behavior normal.         Assessment And Plan:  Class 1 obesity due to excess calories with serious comorbidity and body mass index (BMI) of 33.0 to 33.9 in adult Assessment & Plan: Chronic, her goal weight is 160lbs.  She will continue with [facility] 2.4mg  weekly. She is encouraged to incorporate more strength training into her weekly workout routine.  - Incorporate strength training twice a week. - Ensure protein intake of at least 100 grams daily.   Hypertensive heart disease without heart failure Assessment & Plan: Chronic, well controlled.  She has been taken off of valsartan by Cardiology. She will continue with amlodipine 2.5mg  daily. Encouraged to follow low sodium diet.   Orders: -     CMP14+EGFR  Coronary artery calcification of native artery Assessment & Plan: Chronic, now on statin therapy. LDL goal is less than 70.  She will continue with rosuvastatin 20mg  daily.  She is encouraged to follow a heart healthy lifestyle.    Chronic bilateral low back pain without sciatica Assessment & Plan: Chronic pain exacerbated by activity and weather, disrupting sleep and daily activities. Previous X-ray showed slippage. Hesitant about further orthopedic evaluation. - Advise cyclobenzaprine at night for muscle relaxation. - Encourage regular stretching exercises. - Consider orthopedic evaluation if symptoms persist or  worsen.   Pure hypercholesterolemia Assessment & Plan: Chronic, LDL goal is less than 70 due to underlying non-obstructive CAD.  She is encouraged to avoid fried foods, decrease intake of processed meats and live an active lifestyle. She will continue with rosuvastatin 20mg  daily.   Orders: -     CMP14+EGFR  Gastroesophageal reflux disease without esophagitis Assessment & Plan: Managed with pantoprazole. Risk of vitamin B12 deficiency due to long-term use. - Continue pantoprazole daily. - Recheck vitamin B12 levels.   Pharmacologic therapy -     Vitamin B12  Pneumococcal vaccination declined  Herpes zoster vaccination declined  Other orders -     amLODIPine Besylate; Take 1 tablet (2.5 mg total) by mouth daily.  Dispense: 90 tablet; Refill: 2 -     buPROPion HCl ER (XL); Take 1 tablet (150 mg total) by mouth every morning.  Dispense: 90 tablet; Refill: 2 -     Pantoprazole Sodium; Take 1 tablet (40 mg total) by mouth daily.  Dispense: 90 tablet; Refill: 1 -     Wegovy; Inject 2.4 mg into the skin once a week.  Dispense: 3 mL; Refill: 2   Return if symptoms worsen or fail to improve.  Patient was given opportunity to ask questions. Patient verbalized understanding of the plan and was able to repeat key elements of the plan. All questions were answered to their satisfaction.    I, Danielle Dung, MD, have reviewed all documentation for this visit. The documentation on 08/27/23 for the exam, diagnosis, procedures, and orders are all accurate and complete.   IF YOU HAVE BEEN REFERRED TO A SPECIALIST, IT MAY TAKE 1-2 WEEKS TO SCHEDULE/PROCESS THE REFERRAL. IF YOU HAVE NOT HEARD FROM US /SPECIALIST IN TWO WEEKS, PLEASE GIVE US  A CALL AT [phone] X [phone].

## 2023-08-27 NOTE — Patient Instructions (Signed)

## 2023-08-27 NOTE — Assessment & Plan Note (Signed)
 Chronic, well controlled.  She has been taken off of valsartan by Cardiology. She will continue with amlodipine 2.5mg  daily. Encouraged to follow low sodium diet.

## 2023-08-28 ENCOUNTER — Encounter: Payer: Self-pay | Admitting: Internal Medicine

## 2023-08-28 LAB — CMP14+EGFR
ALT: 25 IU/L (ref 0–32)
AST: 34 IU/L (ref 0–40)
Albumin: 4.7 g/dL (ref 3.8–4.9)
Alkaline Phosphatase: 78 IU/L (ref 44–121)
BUN/Creatinine Ratio: 15 (ref 12–28)
BUN: 13 mg/dL (ref 8–27)
Bilirubin Total: 0.5 mg/dL (ref 0.0–1.2)
CO2: 22 mmol/L (ref 20–29)
Calcium: 9.6 mg/dL (ref 8.7–10.3)
Chloride: 101 mmol/L (ref 96–106)
Creatinine, Ser: 0.86 mg/dL (ref 0.57–1.00)
Globulin, Total: 2.4 g/dL (ref 1.5–4.5)
Glucose: 89 mg/dL (ref 70–99)
Potassium: 4.2 mmol/L (ref 3.5–5.2)
Sodium: 139 mmol/L (ref 134–144)
Total Protein: 7.1 g/dL (ref 6.0–8.5)
eGFR: 77 mL/min/{1.73_m2} (ref >59)

## 2023-08-28 LAB — VITAMIN B12: Vitamin B-12: 410 pg/mL (ref 232–1245)

## 2023-08-31 ENCOUNTER — Encounter: Payer: Self-pay | Admitting: Internal Medicine

## 2023-09-02 ENCOUNTER — Other Ambulatory Visit: Payer: Self-pay | Admitting: Internal Medicine

## 2023-09-02 DIAGNOSIS — K219 Gastro-esophageal reflux disease without esophagitis: Secondary | ICD-10-CM | POA: Insufficient documentation

## 2023-09-02 DIAGNOSIS — I10 Essential (primary) hypertension: Secondary | ICD-10-CM

## 2023-09-02 NOTE — Assessment & Plan Note (Addendum)
 Chronic, now on statin therapy. LDL goal is less than 70.  She will continue with rosuvastatin 20mg  daily.  She is encouraged to follow a heart healthy lifestyle.

## 2023-09-02 NOTE — Assessment & Plan Note (Addendum)
 Chronic, LDL goal is less than 70 due to underlying non-obstructive CAD.  She is encouraged to avoid fried foods, decrease intake of processed meats and live an active lifestyle. She will continue with rosuvastatin 20mg  daily.

## 2023-09-02 NOTE — Assessment & Plan Note (Signed)
 Chronic pain exacerbated by activity and weather, disrupting sleep and daily activities. Previous X-ray showed slippage. Hesitant about further orthopedic evaluation. - Advise cyclobenzaprine at night for muscle relaxation. - Encourage regular stretching exercises. - Consider orthopedic evaluation if symptoms persist or worsen.

## 2023-09-02 NOTE — Assessment & Plan Note (Signed)
 Managed with pantoprazole. Risk of vitamin B12 deficiency due to long-term use. - Continue pantoprazole daily. - Recheck vitamin B12 levels.

## 2023-09-03 ENCOUNTER — Other Ambulatory Visit (HOSPITAL_COMMUNITY)
Admission: RE | Admit: 2023-09-03 | Discharge: 2023-09-03 | Disposition: A | Discharge status: Home / Self Care | Source: Ambulatory Visit | Attending: Internal Medicine | Admitting: Internal Medicine

## 2023-09-03 ENCOUNTER — Ambulatory Visit
Admission: RE | Admit: 2023-09-03 | Discharge: 2023-09-03 | Disposition: A | Discharge status: Home / Self Care | Source: Ambulatory Visit | Attending: Internal Medicine | Admitting: Internal Medicine

## 2023-09-03 DIAGNOSIS — E041 Nontoxic single thyroid nodule: Secondary | ICD-10-CM | POA: Insufficient documentation

## 2023-09-04 LAB — CYTOLOGY - NON PAP

## 2023-09-05 ENCOUNTER — Other Ambulatory Visit: Payer: Self-pay | Admitting: Internal Medicine

## 2023-09-05 MED ORDER — TRAMADOL HCL 50 MG PO TABS: 50.0000 mg | ORAL_TABLET | Freq: Four times a day (QID) | ORAL | 0 refills | Status: AC | PRN

## 2023-09-05 MED ORDER — CYCLOBENZAPRINE HCL 5 MG PO TABS: 5.0000 mg | ORAL_TABLET | Freq: Three times a day (TID) | ORAL | 0 refills | Status: AC | PRN

## 2023-09-08 ENCOUNTER — Encounter: Payer: Self-pay | Admitting: Internal Medicine

## 2023-09-11 DIAGNOSIS — E782 Mixed hyperlipidemia: Secondary | ICD-10-CM | POA: Diagnosis not present

## 2023-09-11 DIAGNOSIS — I10 Essential (primary) hypertension: Secondary | ICD-10-CM | POA: Diagnosis not present

## 2023-09-11 DIAGNOSIS — K5730 Diverticulosis of large intestine without perforation or abscess without bleeding: Secondary | ICD-10-CM | POA: Diagnosis not present

## 2023-09-11 DIAGNOSIS — K52832 Lymphocytic colitis: Secondary | ICD-10-CM | POA: Diagnosis not present

## 2023-09-11 DIAGNOSIS — Z1211 Encounter for screening for malignant neoplasm of colon: Secondary | ICD-10-CM | POA: Diagnosis not present

## 2023-09-13 DIAGNOSIS — M791 Myalgia, unspecified site: Secondary | ICD-10-CM | POA: Diagnosis not present

## 2023-09-13 DIAGNOSIS — M545 Low back pain, unspecified: Secondary | ICD-10-CM | POA: Diagnosis not present

## 2023-09-13 DIAGNOSIS — M25572 Pain in left ankle and joints of left foot: Secondary | ICD-10-CM | POA: Diagnosis not present

## 2023-09-24 DIAGNOSIS — Z1231 Encounter for screening mammogram for malignant neoplasm of breast: Secondary | ICD-10-CM | POA: Diagnosis not present

## 2023-09-24 DIAGNOSIS — M8588 Other specified disorders of bone density and structure, other site: Secondary | ICD-10-CM | POA: Diagnosis not present

## 2023-09-24 LAB — HM DEXA SCAN

## 2023-09-27 ENCOUNTER — Encounter: Payer: Self-pay | Admitting: Internal Medicine

## 2023-10-17 DIAGNOSIS — Z1211 Encounter for screening for malignant neoplasm of colon: Secondary | ICD-10-CM | POA: Diagnosis not present

## 2023-10-17 DIAGNOSIS — K5730 Diverticulosis of large intestine without perforation or abscess without bleeding: Secondary | ICD-10-CM | POA: Diagnosis not present

## 2023-10-17 DIAGNOSIS — Z83719 Family history of colon polyps, unspecified: Secondary | ICD-10-CM | POA: Diagnosis not present

## 2023-10-17 LAB — HM COLONOSCOPY

## 2023-11-14 ENCOUNTER — Other Ambulatory Visit: Payer: Self-pay | Admitting: Internal Medicine

## 2023-12-03 NOTE — Patient Instructions (Signed)

## 2023-12-03 NOTE — Progress Notes (Unsigned)
 I,[name], CMA,acting as a Neurosurgeon for Danielle LOISE Slocumb, MD.,have documented all relevant documentation on the behalf of Danielle LOISE Slocumb, MD,as directed by  Danielle LOISE Slocumb, MD while in the presence of Danielle LOISE Slocumb, MD.  Subjective:    Patient ID: Danielle Silva , female    DOB: 01/19/1961 , 63 y.o.   MRN: [id number]  No chief complaint on file.   HPI  HPI   Past Medical History:  Diagnosis Date   Anxiety    Complication of anesthesia    Coronary artery disease    Depression    Diverticulitis    GERD (gastroesophageal reflux disease)    Hyperlipidemia    Hypertension    PONV (postoperative nausea and vomiting)      Family History  Problem Relation Age of Onset   Diabetes Mother    Hypertension Mother    Hyperlipidemia Mother    Heart disease Mother        CABG   Hypertension Father    Kidney disease Father        kidney transplant   Thyroid  disease Father    Heart disease Father        A-fib   Arthritis Brother      Current Outpatient Medications:    amLODipine  (NORVASC ) 2.5 MG tablet, Take 1 tablet (2.5 mg total) by mouth daily., Disp: 90 tablet, Rfl: 2   buPROPion  (WELLBUTRIN  XL) 150 MG 24 hr tablet, Take 1 tablet (150 mg total) by mouth every morning., Disp: 90 tablet, Rfl: 2   cetirizine (ZYRTEC) 5 MG tablet, Take 5 mg by mouth daily., Disp: , Rfl:    Cyanocobalamin  (VITAMIN B-12 PO), Take 1 capsule by mouth daily., Disp: , Rfl:    cyclobenzaprine  (FLEXERIL ) 5 MG tablet, Take 1 tablet (5 mg total) by mouth 3 (three) times daily as needed for muscle spasms., Disp: 30 tablet, Rfl: 0   MAGNESIUM PO, Take 1 capsule by mouth daily., Disp: , Rfl:    ondansetron  (ZOFRAN -ODT) 4 MG disintegrating tablet, Take 1 tablet (4 mg total) by mouth every 8 (eight) hours as needed for nausea or vomiting., Disp: 20 tablet, Rfl: 0   pantoprazole  (PROTONIX ) 40 MG tablet, Take 1 tablet (40 mg total) by mouth daily., Disp: 90 tablet, Rfl: 1   rosuvastatin  (CRESTOR ) 20 MG  tablet, Take 1 tablet (20 mg total) by mouth daily., Disp: 90 tablet, Rfl: 3   traMADol  (ULTRAM ) 50 MG tablet, Take 1 tablet (50 mg total) by mouth every 6 (six) hours as needed., Disp: 20 tablet, Rfl: 0   VITAMIN D  PO, Take 1 capsule by mouth daily., Disp: , Rfl:    WEGOVY  2.4 MG/0.75ML SOAJ, INJECT 2.4 MG INTO THE SKIN ONCE A WEEK., Disp: 3 mL, Rfl: 2   Allergies  Allergen Reactions   Macrobid [Nitrofurantoin Monohydrate Macrocrystals] Hives   Nsaids Other (See Comments)    Avoids bc of colitis    Percocet [Oxycodone -Acetaminophen ] Itching and Nausea And Vomiting    Can take with benadryl      The patient states she uses {contraceptive methods:5051} for birth control. Patient's last menstrual period was 01/05/2012.. {Dysmenorrhea-menorrhagia:21918}. Negative for: breast discharge, breast lump(s), breast pain and breast self exam. Associated symptoms include abnormal vaginal bleeding. Pertinent negatives include abnormal bleeding (hematology), anxiety, decreased libido, depression, difficulty falling sleep, dyspareunia, history of infertility, nocturia, sexual dysfunction, sleep disturbances, urinary incontinence, urinary urgency, vaginal discharge and vaginal itching. Diet regular.The patient states her exercise level is    .  The patient's tobacco use is:  Social History   Tobacco Use  Smoking Status Former   Types: Cigarettes  Smokeless Tobacco Never  . She has been exposed to passive smoke. The patient's alcohol use is:  Social History   Substance and Sexual Activity  Alcohol Use Yes   Comment: occ  . Additional information: Last pap ***, next one scheduled for ***.    Review of Systems  Constitutional: Negative.   HENT: Negative.    Eyes: Negative.   Respiratory: Negative.    Cardiovascular: Negative.   Gastrointestinal: Negative.   Endocrine: Negative.   Genitourinary: Negative.   Musculoskeletal: Negative.   Skin: Negative.   Allergic/Immunologic: Negative.    Neurological: Negative.   Hematological: Negative.   Psychiatric/Behavioral: Negative.       There were no vitals filed for this visit. There is no height or weight on file to calculate BMI.  Wt Readings from Last 3 Encounters:  08/27/23 184 lb (83.5 kg)  06/27/23 185 lb (83.9 kg)  05/31/23 182 lb 9.6 oz (82.8 kg)     Objective:  Physical Exam      Assessment And Plan:     Routine general medical examination at health care facility  Hypertensive heart disease without heart failure  Pure hypercholesterolemia  Coronary artery calcification of native artery  Gastroesophageal reflux disease without esophagitis     No follow-ups on file. Patient was given opportunity to ask questions. Patient verbalized understanding of the plan and was able to repeat key elements of the plan. All questions were answered to their satisfaction.   Danielle LOISE Slocumb, MD  I, Danielle LOISE Slocumb, MD, have reviewed all documentation for this visit. The documentation on 12/03/23 for the exam, diagnosis, procedures, and orders are all accurate and complete.

## 2023-12-04 ENCOUNTER — Ambulatory Visit: Payer: Self-pay | Admitting: Internal Medicine

## 2023-12-04 ENCOUNTER — Encounter: Payer: Self-pay | Admitting: Internal Medicine

## 2023-12-04 ENCOUNTER — Ambulatory Visit (INDEPENDENT_AMBULATORY_CARE_PROVIDER_SITE_OTHER): Payer: Self-pay | Admitting: Internal Medicine

## 2023-12-04 VITALS — BP 112/70 | HR 67 | Temp 98.6°F | Ht 62.0 in | Wt 185.0 lb

## 2023-12-04 DIAGNOSIS — I2584 Coronary atherosclerosis due to calcified coronary lesion: Secondary | ICD-10-CM | POA: Diagnosis not present

## 2023-12-04 DIAGNOSIS — Z0000 Encounter for general adult medical examination without abnormal findings: Secondary | ICD-10-CM | POA: Diagnosis not present

## 2023-12-04 DIAGNOSIS — E6609 Other obesity due to excess calories: Secondary | ICD-10-CM

## 2023-12-04 DIAGNOSIS — E7800 Pure hypercholesterolemia, unspecified: Secondary | ICD-10-CM

## 2023-12-04 DIAGNOSIS — Z2821 Immunization not carried out because of patient refusal: Secondary | ICD-10-CM

## 2023-12-04 DIAGNOSIS — Z5320 Procedure and treatment not carried out because of patient's decision for unspecified reasons: Secondary | ICD-10-CM

## 2023-12-04 DIAGNOSIS — K219 Gastro-esophageal reflux disease without esophagitis: Secondary | ICD-10-CM

## 2023-12-04 DIAGNOSIS — I119 Hypertensive heart disease without heart failure: Secondary | ICD-10-CM

## 2023-12-04 DIAGNOSIS — Z6833 Body mass index (BMI) 33.0-33.9, adult: Secondary | ICD-10-CM

## 2023-12-04 DIAGNOSIS — E66811 Obesity, class 1: Secondary | ICD-10-CM

## 2023-12-04 DIAGNOSIS — I2510 Atherosclerotic heart disease of native coronary artery without angina pectoris: Secondary | ICD-10-CM | POA: Diagnosis not present

## 2023-12-04 LAB — POCT URINALYSIS DIP (CLINITEK)
Bilirubin, UA: NEGATIVE
Blood, UA: NEGATIVE
Glucose, UA: NEGATIVE mg/dL
Ketones, POC UA: NEGATIVE mg/dL
Leukocytes, UA: NEGATIVE
Nitrite, UA: NEGATIVE
POC PROTEIN,UA: NEGATIVE
Spec Grav, UA: 1.015 (ref 1.010–1.025)
Urobilinogen, UA: 0.2 U/dL
pH, UA: 7 (ref 5.0–8.0)

## 2023-12-04 MED ORDER — WEGOVY 2.4 MG/0.75ML ~~LOC~~ SOAJ: 2.4000 mg | SUBCUTANEOUS | 3 refills | Status: DC

## 2023-12-04 MED ORDER — ASPIRIN 81 MG PO TBEC: DELAYED_RELEASE_TABLET | ORAL | Status: AC

## 2023-12-04 NOTE — Assessment & Plan Note (Addendum)
 Chronic, well controlled.  EKG performed, NSR w/ nonspecific T abnormality.  She will continue with amlodipine  2.5mg  daily. Encouraged to follow low sodium diet.  - Follow up in six months for re-evaluation - Consider coconut water for electrolyte replacement.

## 2023-12-04 NOTE — Assessment & Plan Note (Signed)
 On rosuvastatin  10 mg, reduced from 20 mg due to fatigue. Goal to maintain LDL below 70. CoQ10 supplementation discussed for fatigue. - Continue rosuvastatin  10 mg. - Recommend CoQ10 supplementation. - Order cholesterol panel.

## 2023-12-04 NOTE — Assessment & Plan Note (Signed)
 Chronic, sx well controlled with pantoprazole .  - Reminded to stop eating 3 hrs prior to lying down.

## 2023-12-04 NOTE — Assessment & Plan Note (Signed)
 Chronic, now on statin therapy. LDL goal is less than 70.  She will continue with rosuvastatin 20mg  daily.  She is encouraged to follow a heart healthy lifestyle.

## 2023-12-04 NOTE — Assessment & Plan Note (Signed)
 On Wegovy  2.4mg , part of a program requiring compliance for insurance coverage. Program includes linked scale and step tracking. - Continue Wegovy . - Ensure compliance with weight management program. - Send Wegovy  prescription to [facility].

## 2023-12-04 NOTE — Assessment & Plan Note (Signed)

## 2023-12-06 LAB — CMP14+EGFR
ALT: 16 IU/L (ref 0–32)
AST: 24 IU/L (ref 0–40)
Albumin: 4.5 g/dL (ref 3.8–4.9)
Alkaline Phosphatase: 78 IU/L (ref 44–121)
BUN/Creatinine Ratio: 8 — ABNORMAL LOW (ref 12–28)
BUN: 6 mg/dL — ABNORMAL LOW (ref 8–27)
Bilirubin Total: 0.8 mg/dL (ref 0.0–1.2)
CO2: 24 mmol/L (ref 20–29)
Calcium: 9.9 mg/dL (ref 8.7–10.3)
Chloride: 101 mmol/L (ref 96–106)
Creatinine, Ser: 0.79 mg/dL (ref 0.57–1.00)
Globulin, Total: 2.3 g/dL (ref 1.5–4.5)
Glucose: 81 mg/dL (ref 70–99)
Potassium: 4.2 mmol/L (ref 3.5–5.2)
Sodium: 140 mmol/L (ref 134–144)
Total Protein: 6.8 g/dL (ref 6.0–8.5)
eGFR: 86 mL/min/1.73 (ref >59)

## 2023-12-06 LAB — LIPID PANEL
Chol/HDL Ratio: 2.7 ratio (ref 0.0–4.4)
Cholesterol, Total: 147 mg/dL (ref 100–199)
HDL: 55 mg/dL (ref >39)
LDL Chol Calc (NIH): 75 mg/dL (ref 0–99)
Triglycerides: 88 mg/dL (ref 0–149)
VLDL Cholesterol Cal: 17 mg/dL (ref 5–40)

## 2023-12-06 LAB — CBC
Hematocrit: 40.8 % (ref 34.0–46.6)
Hemoglobin: 13.6 g/dL (ref 11.1–15.9)
MCH: 31.3 pg (ref 26.6–33.0)
MCHC: 33.3 g/dL (ref 31.5–35.7)
MCV: 94 fL (ref 79–97)
Platelets: 322 x10E3/uL (ref 150–450)
RBC: 4.35 x10E6/uL (ref 3.77–5.28)
RDW: 12.5 % (ref 11.7–15.4)
WBC: 6.3 x10E3/uL (ref 3.4–10.8)

## 2023-12-06 LAB — VITAMIN D 25 HYDROXY (VIT D DEFICIENCY, FRACTURES): Vit D, 25-Hydroxy: 47.1 ng/mL (ref 30.0–100.0)

## 2023-12-06 LAB — MICROALBUMIN / CREATININE URINE RATIO
Creatinine, Urine: 12.5 mg/dL
Microalb/Creat Ratio: <24 mg/g{creat} (ref 0–29)
Microalbumin, Urine: <3 ug/mL

## 2024-01-24 ENCOUNTER — Encounter: Payer: Self-pay | Admitting: Internal Medicine

## 2024-02-19 ENCOUNTER — Ambulatory Visit: Payer: Self-pay

## 2024-02-25 ENCOUNTER — Other Ambulatory Visit: Payer: Self-pay | Admitting: Internal Medicine

## 2024-02-25 ENCOUNTER — Other Ambulatory Visit: Payer: Self-pay | Admitting: Nurse Practitioner

## 2024-02-26 ENCOUNTER — Other Ambulatory Visit: Payer: Self-pay | Admitting: Internal Medicine

## 2024-03-05 ENCOUNTER — Other Ambulatory Visit: Payer: Self-pay | Admitting: Nurse Practitioner

## 2024-03-07 ENCOUNTER — Ambulatory Visit

## 2024-03-10 MED ORDER — ROSUVASTATIN CALCIUM 20 MG PO TABS: 20.0000 mg | ORAL_TABLET | Freq: Every day | ORAL | 0 refills | Status: DC

## 2024-03-11 ENCOUNTER — Ambulatory Visit

## 2024-03-25 ENCOUNTER — Ambulatory Visit

## 2024-05-06 ENCOUNTER — Other Ambulatory Visit: Payer: Self-pay | Admitting: Internal Medicine

## 2024-05-30 ENCOUNTER — Other Ambulatory Visit: Payer: Self-pay | Admitting: Internal Medicine

## 2024-05-31 ENCOUNTER — Other Ambulatory Visit: Payer: Self-pay | Admitting: Internal Medicine

## 2024-06-02 ENCOUNTER — Other Ambulatory Visit: Payer: Self-pay | Admitting: Internal Medicine

## 2024-06-06 ENCOUNTER — Other Ambulatory Visit: Payer: Self-pay | Admitting: Cardiovascular Disease

## 2024-06-09 ENCOUNTER — Ambulatory Visit (INDEPENDENT_AMBULATORY_CARE_PROVIDER_SITE_OTHER): Payer: Self-pay | Admitting: Internal Medicine

## 2024-06-09 DIAGNOSIS — Z91199 Patient's noncompliance with other medical treatment and regimen due to unspecified reason: Secondary | ICD-10-CM

## 2024-06-09 NOTE — Patient Instructions (Signed)
 Hypertension, Adult Hypertension is another name for high blood pressure. High blood pressure forces your heart to work harder to pump blood. This can cause problems over time. There are two numbers in a blood pressure reading. There is a top number (systolic) over a bottom number (diastolic). It is best to have a blood pressure that is below 120/80. What are the causes? The cause of this condition is not known. Some other conditions can lead to high blood pressure. What increases the risk? Some lifestyle factors can make you more likely to develop high blood pressure: Smoking. Not getting enough exercise or physical activity. Being overweight. Having too much fat, sugar, calories, or salt (sodium) in your diet. Drinking too much alcohol. Other risk factors include: Having any of these conditions: Heart disease. Diabetes. High cholesterol. Kidney disease. Obstructive sleep apnea. Having a family history of high blood pressure and high cholesterol. Age. The risk increases with age. Stress. What are the signs or symptoms? High blood pressure may not cause symptoms. Very high blood pressure (hypertensive crisis) may cause: Headache. Fast or uneven heartbeats (palpitations). Shortness of breath. Nosebleed. Vomiting or feeling like you may vomit (nauseous). Changes in how you see. Very bad chest pain. Feeling dizzy. Seizures. How is this treated? This condition is treated by making healthy lifestyle changes, such as: Eating healthy foods. Exercising more. Drinking less alcohol. Your doctor may prescribe medicine if lifestyle changes do not help enough and if: Your top number is above 130. Your bottom number is above 80. Your personal target blood pressure may vary. Follow these instructions at home: Eating and drinking  If told, follow the DASH eating plan. To follow this plan: Fill one half of your plate at each meal with fruits and vegetables. Fill one fourth of your plate  at each meal with whole grains. Whole grains include whole-wheat pasta, brown rice, and whole-grain bread. Eat or drink low-fat dairy products, such as skim milk or low-fat yogurt. Fill one fourth of your plate at each meal with low-fat (lean) proteins. Low-fat proteins include fish, chicken without skin, eggs, beans, and tofu. Avoid fatty meat, cured and processed meat, or chicken with skin. Avoid pre-made or processed food. Limit the amount of salt in your diet to less than 1,500 mg each day. Do not drink alcohol if: Your doctor tells you not to drink. You are pregnant, may be pregnant, or are planning to become pregnant. If you drink alcohol: Limit how much you have to: 0-1 drink a day for women. 0-2 drinks a day for men. Know how much alcohol is in your drink. In the [place] one drink equals one 12 oz bottle of beer (355 mL), one 5 oz glass of wine (148 mL), or one 1 oz glass of hard liquor (44 mL). Lifestyle  Work with your doctor to stay at a healthy weight or to lose weight. Ask your doctor what the best weight is for you. Get at least 30 minutes of exercise that causes your heart to beat faster (aerobic exercise) most days of the week. This may include walking, swimming, or biking. Get at least 30 minutes of exercise that strengthens your muscles (resistance exercise) at least 3 days a week. This may include lifting weights or doing Pilates. Do not smoke or use any products that contain nicotine or tobacco. If you need help quitting, ask your doctor. Check your blood pressure at home as told by your doctor. Keep all follow-up visits. Medicines Take over-the-counter and prescription medicines  only as told by your doctor. Follow directions carefully. Do not skip doses of blood pressure medicine. The medicine does not work as well if you skip doses. Skipping doses also puts you at risk for problems. Ask your doctor about side effects or reactions to medicines that you should watch  for. Contact a doctor if: You think you are having a reaction to the medicine you are taking. You have headaches that keep coming back. You feel dizzy. You have swelling in your ankles. You have trouble with your vision. Get help right away if: You get a very bad headache. You start to feel mixed up (confused). You feel weak or numb. You feel faint. You have very bad pain in your: Chest. Belly (abdomen). You vomit more than once. You have trouble breathing. These symptoms may be an emergency. Get help right away. Call 911. Do not wait to see if the symptoms will go away. Do not drive yourself to the hospital. Summary Hypertension is another name for high blood pressure. High blood pressure forces your heart to work harder to pump blood. For most people, a normal blood pressure is less than 120/80. Making healthy choices can help lower blood pressure. If your blood pressure does not get lower with healthy choices, you may need to take medicine. This information is not intended to replace advice given to you by your health care provider. Make sure you discuss any questions you have with your health care provider. Document Revised: 02/24/2021 Document Reviewed: 02/24/2021 Elsevier Patient Education  2024 [employer].

## 2024-06-09 NOTE — Progress Notes (Signed)
 Pt is a no show.  RS

## 2024-06-11 ENCOUNTER — Encounter: Payer: Self-pay | Admitting: Internal Medicine

## 2024-06-18 ENCOUNTER — Ambulatory Visit: Payer: Self-pay

## 2024-06-18 VITALS — BP 110/60 | HR 83 | Temp 98.1°F | Ht 62.0 in | Wt 189.0 lb

## 2024-06-18 DIAGNOSIS — I119 Hypertensive heart disease without heart failure: Secondary | ICD-10-CM

## 2024-06-18 DIAGNOSIS — F419 Anxiety disorder, unspecified: Secondary | ICD-10-CM

## 2024-06-18 DIAGNOSIS — R39851 Costovertebral (angle) tenderness, right side: Secondary | ICD-10-CM

## 2024-06-18 DIAGNOSIS — R110 Nausea: Secondary | ICD-10-CM

## 2024-06-18 DIAGNOSIS — N390 Urinary tract infection, site not specified: Secondary | ICD-10-CM

## 2024-06-18 DIAGNOSIS — Z87440 Personal history of urinary (tract) infections: Secondary | ICD-10-CM

## 2024-06-18 LAB — POCT URINALYSIS DIP (CLINITEK)
Bilirubin, UA: NEGATIVE
Blood, UA: NEGATIVE
Glucose, UA: NEGATIVE mg/dL
Ketones, POC UA: NEGATIVE mg/dL
Nitrite, UA: NEGATIVE
POC PROTEIN,UA: NEGATIVE
Spec Grav, UA: 1.015
Urobilinogen, UA: 0.2 U/dL
pH, UA: 6.5

## 2024-06-18 MED ORDER — ONDANSETRON 4 MG PO TBDP: 4.0000 mg | ORAL_TABLET | Freq: Three times a day (TID) | ORAL | 0 refills | Status: AC | PRN

## 2024-06-18 MED ORDER — CEFTRIAXONE SODIUM 1 G IJ SOLR
1.0000 g | Freq: Once | INTRAMUSCULAR | Status: AC
  Administered 2024-06-18: 1 g via INTRAMUSCULAR

## 2024-06-18 NOTE — Progress Notes (Signed)
 I,[name], CMA,acting as a neurosurgeon for Danielle JONELLE Fischer, DO.,have documented all relevant documentation on the behalf of Danielle JONELLE Fischer, DO,as directed by  Danielle JONELLE Fischer, DO while in the presence of Danielle JONELLE Fischer, DO.  Subjective:  Patient ID: Danielle Silva , female    DOB: [birth year] , 64 y.o.   MRN: [id number]  No chief complaint on file.   HPI  Hypertension This is a chronic problem. The current episode started more than 1 year ago. The problem has been gradually improving since onset. The problem is controlled. Pertinent negatives include no blurred vision, chest pain or shortness of breath. Risk factors for coronary artery disease include sedentary lifestyle and post-menopausal state. The current treatment provides moderate improvement. There is no history of kidney disease or CAD/MI.     Past Medical History:  Diagnosis Date   Anxiety    Complication of anesthesia    Coronary artery disease    Depression    Diverticulitis    GERD (gastroesophageal reflux disease)    Hyperlipidemia    Hypertension    PONV (postoperative nausea and vomiting)      Family History  Problem Relation Age of Onset   Diabetes Mother    Hypertension Mother    Hyperlipidemia Mother    Heart disease Mother        CABG   Hypertension Father    Kidney disease Father        kidney transplant   Thyroid  disease Father    Heart disease Father        A-fib   Arthritis Brother     Current Medications[1]   Allergies[2]   Review of Systems  Eyes:  Negative for blurred vision.  Respiratory:  Negative for shortness of breath.   Cardiovascular:  Negative for chest pain.     There were no vitals filed for this visit. There is no height or weight on file to calculate BMI.  Wt Readings from Last 3 Encounters:  12/04/23 185 lb (83.9 kg)  08/27/23 184 lb (83.5 kg)  06/27/23 185 lb (83.9 kg)    The 10-year ASCVD risk score (Arnett DK, et al., 2019) is: 3%   Values used to calculate the  score:     Age: 77 years     Clinically relevant sex: Female     Is Non-Hispanic African American: No     Diabetic: No     Tobacco smoker: No     Systolic Blood Pressure: 112 mmHg     Is BP treated: Yes     HDL Cholesterol: 55 mg/dL     Total Cholesterol: 147 mg/dL  Objective:  Physical Exam      Assessment And Plan:   Assessment & Plan Hypertensive heart disease without heart failure  Coronary artery calcification of native artery  Pure hypercholesterolemia  Class 1 obesity due to excess calories with body mass index (BMI) of 34.0 to 34.9 in adult, unspecified whether serious comorbidity present  History of UTI   No orders of the defined types were placed in this encounter.    No follow-ups on file.  Patient was given opportunity to ask questions. Patient verbalized understanding of the plan and was able to repeat key elements of the plan. All questions were answered to their satisfaction.    LILLETTE Danielle JONELLE Fischer, DO, have reviewed all documentation for this visit. The documentation on 06/18/24 for the exam, diagnosis, procedures, and orders are all accurate and complete.  IF YOU HAVE BEEN REFERRED TO A SPECIALIST, IT MAY TAKE 1-2 WEEKS TO SCHEDULE/PROCESS THE REFERRAL. IF YOU HAVE NOT HEARD FROM US /SPECIALIST IN TWO WEEKS, PLEASE GIVE US  A CALL AT [phone].     [1]  Current Outpatient Medications:    amLODipine  (NORVASC ) 2.5 MG tablet, TAKE 1 TABLET BY MOUTH EVERY DAY, Disp: 90 tablet, Rfl: 2   aspirin  EC 81 MG tablet, Take one tab on Mon/Wed/Fri, Disp: , Rfl:    buPROPion  (WELLBUTRIN  XL) 150 MG 24 hr tablet, TAKE 1 TABLET BY MOUTH EVERY DAY IN THE MORNING, Disp: 90 tablet, Rfl: 2   cetirizine (ZYRTEC) 5 MG tablet, Take 5 mg by mouth daily., Disp: , Rfl:    Cyanocobalamin  (VITAMIN B-12 PO), Take 1 capsule by mouth daily., Disp: , Rfl:    cyclobenzaprine  (FLEXERIL ) 5 MG tablet, Take 1 tablet (5 mg total) by mouth 3 (three) times daily as needed for muscle  spasms., Disp: 30 tablet, Rfl: 0   MAGNESIUM PO, Take 1 capsule by mouth daily., Disp: , Rfl:    ondansetron  (ZOFRAN -ODT) 4 MG disintegrating tablet, Take 1 tablet (4 mg total) by mouth every 8 (eight) hours as needed for nausea or vomiting., Disp: 20 tablet, Rfl: 0   pantoprazole  (PROTONIX ) 40 MG tablet, TAKE 1 TABLET BY MOUTH EVERY DAY, Disp: 90 tablet, Rfl: 1   rosuvastatin  (CRESTOR ) 20 MG tablet, TAKE 1 TABLET BY MOUTH EVERY DAY, Disp: 30 tablet, Rfl: 0   semaglutide -weight management (WEGOVY ) 2.4 MG/0.75ML SOAJ SQ injection, INJECT 2.4 MG INTO THE SKIN ONCE A WEEK., Disp: 3 mL, Rfl: 3   traMADol  (ULTRAM ) 50 MG tablet, Take 1 tablet (50 mg total) by mouth every 6 (six) hours as needed., Disp: 20 tablet, Rfl: 0   VITAMIN D  PO, Take 1 capsule by mouth daily., Disp: , Rfl:  [2]  Allergies Allergen Reactions   Nitrofurantoin Monohydrate Macrocrystals Hives and Rash   Nsaids Other (See Comments)    Avoids bc of colitis    Percocet [Oxycodone -Acetaminophen ] Itching and Nausea And Vomiting    Can take with benadryl   Tolmetin Nausea Only

## 2024-06-18 NOTE — Progress Notes (Signed)
 "  Acute Office Visit  Subjective:     Patient ID: Danielle Silva, female    DOB: 1961/04/07, 64 y.o.   MRN: [id number]  Chief Complaint  Patient presents with   Hypertension    Patient presents today for bpc. She reports compliance with medication. Denies headache, chest pain & sob. She reports she went to the ER for treatment of a uti. She would like to have a recheck to make sure it is good.       This is a 64 year old white female who is here to follow-up on her HYPERTENSION which she is taking amlodipine  2.5 mg once a day.  She states she does not take the amlodipine  every day.  She has been taking it every day lately since she was diagnosed with having treatment resistant E. coli UTI.  She was diagnosed with having resistant E. coli UTI on 05/10/2024.  She states that she went to urgent care who referred her to the emergency room.  She was given IV antibiotics.  She was referred to a urologist and did see a neurologist on 06/08/2024 who started her on cranberry pills, estrogen cream which she uses every other night and methylene blue which she has not started yet.  She is scheduled to follow back with the urologist in April.  She is concerned because she is planning to go to [facility] this Upcoming weekend with her grandchildren.  She today has been having increased urinary frequency, bilateral flank pain right greater than left that is a constant nagging type pain, nocturia 2 times a night for the past 3 weeks, and she has fatigue and feels like she wants to sleep all the time.  She does have a history of ANXIETY/DEPRESSION that is controlled on bupropion  150 mg extended release once a day.  She does have GERD and is on pantoprazole  40 mg once a day which is controlled, rosuvastatin  20 mg once a day for HYPERLIPIDEMIA, and she is on Wegovy  2.5 mg/0.75 mL.   Review of Systems  Constitutional:  Positive for malaise/fatigue. Negative for chills and fever.  Respiratory:  Negative for  shortness of breath.   Cardiovascular:  Negative for chest pain, palpitations and leg swelling.  Gastrointestinal:  Positive for abdominal pain. Negative for heartburn.       Abdominal pressure  Genitourinary:  Positive for flank pain, frequency and urgency. Negative for dysuria and hematuria.  Neurological:  Positive for dizziness. Negative for headaches.  Psychiatric/Behavioral:  Negative for depression.         Objective:    Vitals:   06/18/24 1104  BP: 110/60  Pulse: 83  Temp: 98.1 F (36.7 C)  Height: 5' 2 (1.575 m)  Weight: 189 lb (85.7 kg)  TempSrc: Oral  BMI (Calculated): 34.56    BP Readings from Last 3 Encounters:  06/18/24 110/60  12/04/23 112/70  08/27/23 110/70   Wt Readings from Last 3 Encounters:  06/18/24 189 lb (85.7 kg)  12/04/23 185 lb (83.9 kg)  08/27/23 184 lb (83.5 kg)      Physical Exam Vitals reviewed.  Constitutional:      Appearance: Normal appearance.     Comments: She has gained 4 pounds of weight since 12/04/2023  Neck:     Vascular: No carotid bruit.  Cardiovascular:     Rate and Rhythm: Normal rate and regular rhythm.     Pulses: Normal pulses.     Heart sounds: Normal heart sounds.  Pulmonary:  Effort: Pulmonary effort is normal.     Breath sounds: Normal breath sounds.  Abdominal:     General: Bowel sounds are normal.     Palpations: Abdomen is soft.     Tenderness: There is abdominal tenderness in the suprapubic area. There is right CVA tenderness.  Musculoskeletal:     Cervical back: Normal range of motion and neck supple.     Right lower leg: No edema.     Left lower leg: No edema.  Skin:    General: Skin is warm and dry.  Neurological:     Mental Status: She is alert.  Psychiatric:        Mood and Affect: Mood normal.        Behavior: Behavior normal.     No results found for any visits on 06/18/24.      Assessment & Plan:   Assessment & Plan Hypertensive heart disease without heart failure       Controlled on amlodipine  2.5 mg once a day, and aspirin  81 mg once a day.    History of UTI A urinalysis done on 06/18/2024 showed the presence of 2+ leukocytes without any blood.  The urine was sent off for culture on 06/18/2024. Orders:   POCT URINALYSIS DIP (CLINITEK)  Urinary tract infection without hematuria, site unspecified She was given 1 g of Rocephin  IM on 06/18/2024.  She was advised to call her urologist to inform them of her UTI. Orders:   Culture, Urine   cefTRIAXone  (ROCEPHIN ) injection 1 g  Costovertebral (angle) tenderness, right side     Nausea She was prescribed Zofran  4 mg to take 1 tablet every 12 hours if needed for up to 7 days on 06/18/2024 Orders:   ondansetron  (ZOFRAN -ODT) 4 MG disintegrating tablet; Take 1-2 tablets (4-8 mg total) by mouth every 8 (eight) hours as needed for up to 7 days for nausea or vomiting.   Return if symptoms worsen or fail to improve.  [name], DO   "

## 2024-06-18 NOTE — Assessment & Plan Note (Addendum)
"       Controlled on amlodipine  2.5 mg once a day, and aspirin  81 mg once a day.    "

## 2024-06-18 NOTE — Patient Instructions (Signed)
 Hypertension, Adult Hypertension is another name for high blood pressure. High blood pressure forces your heart to work harder to pump blood. This can cause problems over time. There are two numbers in a blood pressure reading. There is a top number (systolic) over a bottom number (diastolic). It is best to have a blood pressure that is below 120/80. What are the causes? The cause of this condition is not known. Some other conditions can lead to high blood pressure. What increases the risk? Some lifestyle factors can make you more likely to develop high blood pressure: Smoking. Not getting enough exercise or physical activity. Being overweight. Having too much fat, sugar, calories, or salt (sodium) in your diet. Drinking too much alcohol. Other risk factors include: Having any of these conditions: Heart disease. Diabetes. High cholesterol. Kidney disease. Obstructive sleep apnea. Having a family history of high blood pressure and high cholesterol. Age. The risk increases with age. Stress. What are the signs or symptoms? High blood pressure may not cause symptoms. Very high blood pressure (hypertensive crisis) may cause: Headache. Fast or uneven heartbeats (palpitations). Shortness of breath. Nosebleed. Vomiting or feeling like you may vomit (nauseous). Changes in how you see. Very bad chest pain. Feeling dizzy. Seizures. How is this treated? This condition is treated by making healthy lifestyle changes, such as: Eating healthy foods. Exercising more. Drinking less alcohol. Your doctor may prescribe medicine if lifestyle changes do not help enough and if: Your top number is above 130. Your bottom number is above 80. Your personal target blood pressure may vary. Follow these instructions at home: Eating and drinking  If told, follow the DASH eating plan. To follow this plan: Fill one half of your plate at each meal with fruits and vegetables. Fill one fourth of your plate  at each meal with whole grains. Whole grains include whole-wheat pasta, brown rice, and whole-grain bread. Eat or drink low-fat dairy products, such as skim milk or low-fat yogurt. Fill one fourth of your plate at each meal with low-fat (lean) proteins. Low-fat proteins include fish, chicken without skin, eggs, beans, and tofu. Avoid fatty meat, cured and processed meat, or chicken with skin. Avoid pre-made or processed food. Limit the amount of salt in your diet to less than 1,500 mg each day. Do not drink alcohol if: Your doctor tells you not to drink. You are pregnant, may be pregnant, or are planning to become pregnant. If you drink alcohol: Limit how much you have to: 0-1 drink a day for women. 0-2 drinks a day for men. Know how much alcohol is in your drink. In the [place] one drink equals one 12 oz bottle of beer (355 mL), one 5 oz glass of wine (148 mL), or one 1 oz glass of hard liquor (44 mL). Lifestyle  Work with your doctor to stay at a healthy weight or to lose weight. Ask your doctor what the best weight is for you. Get at least 30 minutes of exercise that causes your heart to beat faster (aerobic exercise) most days of the week. This may include walking, swimming, or biking. Get at least 30 minutes of exercise that strengthens your muscles (resistance exercise) at least 3 days a week. This may include lifting weights or doing Pilates. Do not smoke or use any products that contain nicotine or tobacco. If you need help quitting, ask your doctor. Check your blood pressure at home as told by your doctor. Keep all follow-up visits. Medicines Take over-the-counter and prescription medicines  only as told by your doctor. Follow directions carefully. Do not skip doses of blood pressure medicine. The medicine does not work as well if you skip doses. Skipping doses also puts you at risk for problems. Ask your doctor about side effects or reactions to medicines that you should watch  for. Contact a doctor if: You think you are having a reaction to the medicine you are taking. You have headaches that keep coming back. You feel dizzy. You have swelling in your ankles. You have trouble with your vision. Get help right away if: You get a very bad headache. You start to feel mixed up (confused). You feel weak or numb. You feel faint. You have very bad pain in your: Chest. Belly (abdomen). You vomit more than once. You have trouble breathing. These symptoms may be an emergency. Get help right away. Call 911. Do not wait to see if the symptoms will go away. Do not drive yourself to the hospital. Summary Hypertension is another name for high blood pressure. High blood pressure forces your heart to work harder to pump blood. For most people, a normal blood pressure is less than 120/80. Making healthy choices can help lower blood pressure. If your blood pressure does not get lower with healthy choices, you may need to take medicine. This information is not intended to replace advice given to you by your health care provider. Make sure you discuss any questions you have with your health care provider. Document Revised: 02/24/2021 Document Reviewed: 02/24/2021 Elsevier Patient Education  2024 [employer].

## 2024-06-23 ENCOUNTER — Ambulatory Visit: Payer: Self-pay

## 2024-06-23 DIAGNOSIS — N390 Urinary tract infection, site not specified: Secondary | ICD-10-CM

## 2024-06-23 LAB — URINE CULTURE

## 2024-08-22 ENCOUNTER — Ambulatory Visit: Admitting: Cardiovascular Disease

## 2024-12-09 ENCOUNTER — Encounter: Payer: Self-pay | Admitting: Internal Medicine
